# Patient Record
Sex: Male | Born: 1975 | Race: White | Hispanic: No | Marital: Single | Smoking: Current every day smoker
Health system: Southern US, Community
[De-identification: ages and names within clinical notes are randomized; demographics above are authoritative.]

## PROBLEM LIST (undated history)

## (undated) DIAGNOSIS — F32A Depression, unspecified: Secondary | ICD-10-CM

## (undated) DIAGNOSIS — S2249XA Multiple fractures of ribs, unspecified side, initial encounter for closed fracture: Secondary | ICD-10-CM

## (undated) DIAGNOSIS — F329 Major depressive disorder, single episode, unspecified: Secondary | ICD-10-CM

## (undated) DIAGNOSIS — Z720 Tobacco use: Secondary | ICD-10-CM

## (undated) HISTORY — DX: Multiple fractures of ribs, unspecified side, initial encounter for closed fracture: S22.49XA

## (undated) HISTORY — DX: Morbid (severe) obesity due to excess calories: E66.01

## (undated) HISTORY — DX: Major depressive disorder, single episode, unspecified: F32.9

## (undated) HISTORY — DX: Depression, unspecified: F32.A

## (undated) HISTORY — DX: Tobacco use: Z72.0

---

## 2001-09-16 ENCOUNTER — Emergency Department (HOSPITAL_COMMUNITY): Admission: EM | Admit: 2001-09-16 | Discharge: 2001-09-16 | Payer: Self-pay | Admitting: Emergency Medicine

## 2001-09-16 ENCOUNTER — Encounter: Payer: Self-pay | Admitting: Emergency Medicine

## 2005-03-28 ENCOUNTER — Emergency Department (HOSPITAL_COMMUNITY): Admission: EM | Admit: 2005-03-28 | Discharge: 2005-03-28 | Payer: Self-pay | Admitting: Emergency Medicine

## 2005-07-17 ENCOUNTER — Ambulatory Visit: Payer: Self-pay | Admitting: Internal Medicine

## 2005-07-19 ENCOUNTER — Ambulatory Visit: Payer: Self-pay | Admitting: Internal Medicine

## 2006-06-17 ENCOUNTER — Ambulatory Visit: Payer: Self-pay | Admitting: Internal Medicine

## 2006-08-25 DIAGNOSIS — F1410 Cocaine abuse, uncomplicated: Secondary | ICD-10-CM | POA: Insufficient documentation

## 2006-08-25 DIAGNOSIS — F17200 Nicotine dependence, unspecified, uncomplicated: Secondary | ICD-10-CM | POA: Insufficient documentation

## 2006-08-25 DIAGNOSIS — R5383 Other fatigue: Secondary | ICD-10-CM

## 2006-08-25 DIAGNOSIS — R5381 Other malaise: Secondary | ICD-10-CM

## 2007-06-21 ENCOUNTER — Emergency Department (HOSPITAL_COMMUNITY): Admission: EM | Admit: 2007-06-21 | Discharge: 2007-06-21 | Payer: Self-pay | Admitting: Emergency Medicine

## 2010-09-09 DIAGNOSIS — J939 Pneumothorax, unspecified: Secondary | ICD-10-CM

## 2010-09-09 HISTORY — DX: Pneumothorax, unspecified: J93.9

## 2010-12-18 ENCOUNTER — Emergency Department (HOSPITAL_COMMUNITY): Payer: No Typology Code available for payment source

## 2010-12-18 ENCOUNTER — Inpatient Hospital Stay (HOSPITAL_COMMUNITY)
Admission: EM | Admit: 2010-12-18 | Discharge: 2011-01-01 | DRG: 981 | Disposition: A | Discharge status: Home Health | Payer: No Typology Code available for payment source | Attending: Thoracic Surgery | Admitting: Thoracic Surgery

## 2010-12-18 DIAGNOSIS — S225XXA Flail chest, initial encounter for closed fracture: Principal | ICD-10-CM | POA: Diagnosis present

## 2010-12-18 DIAGNOSIS — R413 Other amnesia: Secondary | ICD-10-CM | POA: Diagnosis present

## 2010-12-18 DIAGNOSIS — R4182 Altered mental status, unspecified: Secondary | ICD-10-CM | POA: Diagnosis present

## 2010-12-18 DIAGNOSIS — J153 Pneumonia due to streptococcus, group B: Secondary | ICD-10-CM | POA: Diagnosis not present

## 2010-12-18 DIAGNOSIS — J9600 Acute respiratory failure, unspecified whether with hypoxia or hypercapnia: Secondary | ICD-10-CM | POA: Diagnosis not present

## 2010-12-18 DIAGNOSIS — J9819 Other pulmonary collapse: Secondary | ICD-10-CM | POA: Diagnosis not present

## 2010-12-18 DIAGNOSIS — F17200 Nicotine dependence, unspecified, uncomplicated: Secondary | ICD-10-CM | POA: Diagnosis present

## 2010-12-18 DIAGNOSIS — Z781 Physical restraint status: Secondary | ICD-10-CM | POA: Diagnosis not present

## 2010-12-18 DIAGNOSIS — J9383 Other pneumothorax: Secondary | ICD-10-CM | POA: Diagnosis present

## 2010-12-18 DIAGNOSIS — S0180XA Unspecified open wound of other part of head, initial encounter: Secondary | ICD-10-CM | POA: Diagnosis present

## 2010-12-18 DIAGNOSIS — Z6841 Body Mass Index (BMI) 40.0 and over, adult: Secondary | ICD-10-CM

## 2010-12-18 DIAGNOSIS — Y998 Other external cause status: Secondary | ICD-10-CM

## 2010-12-18 DIAGNOSIS — D62 Acute posthemorrhagic anemia: Secondary | ICD-10-CM | POA: Diagnosis not present

## 2010-12-18 DIAGNOSIS — E876 Hypokalemia: Secondary | ICD-10-CM | POA: Diagnosis not present

## 2010-12-18 LAB — POCT I-STAT, CHEM 8
BUN: 16 mg/dL (ref 6–23)
Calcium, Ion: 1.18 mmol/L (ref 1.12–1.32)
Chloride: 103 mEq/L (ref 96–112)
Creatinine, Ser: 1.3 mg/dL (ref 0.4–1.5)
Glucose, Bld: 123 mg/dL — ABNORMAL HIGH (ref 70–99)
HCT: 52 % (ref 39.0–52.0)
Hemoglobin: 17.7 g/dL — ABNORMAL HIGH (ref 13.0–17.0)
Potassium: 3.9 mEq/L (ref 3.5–5.1)
Sodium: 142 mEq/L (ref 135–145)
TCO2: 29 mmol/L (ref 0–100)

## 2010-12-18 LAB — CBC
HCT: 50.9 % (ref 39.0–52.0)
MCHC: 34 g/dL (ref 30.0–36.0)
MCV: 88.8 fL (ref 78.0–100.0)
RDW: 13.9 % (ref 11.5–15.5)
WBC: 24.6 10*3/uL — ABNORMAL HIGH (ref 4.0–10.5)

## 2010-12-18 LAB — BASIC METABOLIC PANEL
BUN: 13 mg/dL (ref 6–23)
GFR calc non Af Amer: >60 mL/min (ref >60)
Glucose, Bld: 112 mg/dL — ABNORMAL HIGH (ref 70–99)
Potassium: 4 mEq/L (ref 3.5–5.1)

## 2010-12-19 ENCOUNTER — Inpatient Hospital Stay (HOSPITAL_COMMUNITY): Payer: No Typology Code available for payment source

## 2010-12-19 LAB — CBC
HCT: 47.3 % (ref 39.0–52.0)
Hemoglobin: 16.1 g/dL (ref 13.0–17.0)
MCH: 30.4 pg (ref 26.0–34.0)
MCHC: 34 g/dL (ref 30.0–36.0)
MCV: 89.4 fL (ref 78.0–100.0)

## 2010-12-19 LAB — BASIC METABOLIC PANEL
BUN: 15 mg/dL (ref 6–23)
CO2: 24 mEq/L (ref 19–32)
Calcium: 9.1 mg/dL (ref 8.4–10.5)
Creatinine, Ser: 1.14 mg/dL (ref 0.4–1.5)
GFR calc Af Amer: >60 mL/min (ref >60)
Glucose, Bld: 119 mg/dL — ABNORMAL HIGH (ref 70–99)

## 2010-12-19 MED ORDER — IOHEXOL 300 MG/ML  SOLN
100.0000 mL | Freq: Once | INTRAMUSCULAR | Status: AC | PRN
  Administered 2010-12-19: 100 mL via INTRAVENOUS

## 2010-12-20 ENCOUNTER — Inpatient Hospital Stay (HOSPITAL_COMMUNITY): Payer: No Typology Code available for payment source

## 2010-12-21 ENCOUNTER — Inpatient Hospital Stay (HOSPITAL_COMMUNITY): Payer: No Typology Code available for payment source

## 2010-12-21 LAB — POCT I-STAT 3, ART BLOOD GAS (G3+)
Bicarbonate: 30.2 meq/L — ABNORMAL HIGH (ref 20.0–24.0)
TCO2: 29 mmol/L (ref 0–100)
pCO2 arterial: 87.6 mmHg (ref 35.0–45.0)
pCO2 arterial: 49.7 mmHg — ABNORMAL HIGH (ref 35.0–45.0)
pH, Arterial: 7.183 — CL (ref 7.350–7.450)
pH, Arterial: 7.242 — ABNORMAL LOW (ref 7.350–7.450)
pH, Arterial: 7.347 — ABNORMAL LOW (ref 7.350–7.450)
pO2, Arterial: 218 mmHg — ABNORMAL HIGH (ref 80.0–100.0)

## 2010-12-21 LAB — CBC
Platelets: 191 10*3/uL (ref 150–400)
RBC: 4.46 MIL/uL (ref 4.22–5.81)
RDW: 13.9 % (ref 11.5–15.5)
WBC: 15.1 10*3/uL — ABNORMAL HIGH (ref 4.0–10.5)

## 2010-12-21 LAB — EXPECTORATED SPUTUM ASSESSMENT W GRAM STAIN, RFLX TO RESP C

## 2010-12-21 LAB — BASIC METABOLIC PANEL
BUN: 26 mg/dL — ABNORMAL HIGH (ref 6–23)
Chloride: 105 mEq/L (ref 96–112)
Creatinine, Ser: 1.01 mg/dL (ref 0.4–1.5)
GFR calc Af Amer: >60 mL/min (ref >60)
GFR calc non Af Amer: >60 mL/min (ref >60)
Potassium: 5.4 mEq/L — ABNORMAL HIGH (ref 3.5–5.1)

## 2010-12-22 ENCOUNTER — Inpatient Hospital Stay (HOSPITAL_COMMUNITY): Payer: No Typology Code available for payment source

## 2010-12-22 LAB — POCT I-STAT 3, ART BLOOD GAS (G3+)
O2 Saturation: 94 %
pCO2 arterial: 41.3 mmHg (ref 35.0–45.0)
pH, Arterial: 7.414 (ref 7.350–7.450)
pO2, Arterial: 74 mmHg — ABNORMAL LOW (ref 80.0–100.0)

## 2010-12-22 LAB — CBC
Hemoglobin: 11.8 g/dL — ABNORMAL LOW (ref 13.0–17.0)
MCH: 28.7 pg (ref 26.0–34.0)
Platelets: 188 10*3/uL (ref 150–400)
RBC: 4.11 MIL/uL — ABNORMAL LOW (ref 4.22–5.81)
WBC: 13.1 10*3/uL — ABNORMAL HIGH (ref 4.0–10.5)

## 2010-12-22 LAB — BASIC METABOLIC PANEL
CO2: 26 mEq/L (ref 19–32)
Chloride: 106 mEq/L (ref 96–112)
Creatinine, Ser: 0.99 mg/dL (ref 0.4–1.5)
GFR calc Af Amer: >60 mL/min (ref >60)
Potassium: 4.3 mEq/L (ref 3.5–5.1)
Sodium: 140 mEq/L (ref 135–145)

## 2010-12-23 ENCOUNTER — Inpatient Hospital Stay (HOSPITAL_COMMUNITY): Payer: No Typology Code available for payment source

## 2010-12-23 DIAGNOSIS — S2249XA Multiple fractures of ribs, unspecified side, initial encounter for closed fracture: Secondary | ICD-10-CM

## 2010-12-23 LAB — COMPREHENSIVE METABOLIC PANEL
Alkaline Phosphatase: 39 U/L (ref 39–117)
Calcium: 8.4 mg/dL (ref 8.4–10.5)
GFR calc Af Amer: >60 mL/min (ref >60)
GFR calc non Af Amer: 59 mL/min — ABNORMAL LOW (ref >60)
Potassium: 3.9 mEq/L (ref 3.5–5.1)
Sodium: 144 mEq/L (ref 135–145)
Total Bilirubin: 1.9 mg/dL — ABNORMAL HIGH (ref 0.3–1.2)
Total Protein: 5.4 g/dL — ABNORMAL LOW (ref 6.0–8.3)

## 2010-12-23 LAB — CBC
Hemoglobin: 10.3 g/dL — ABNORMAL LOW (ref 13.0–17.0)
MCH: 29.4 pg (ref 26.0–34.0)
MCHC: 33 g/dL (ref 30.0–36.0)
RDW: 14.4 % (ref 11.5–15.5)

## 2010-12-23 LAB — CULTURE, BAL-QUANTITATIVE W GRAM STAIN: Colony Count: >=100000

## 2010-12-23 LAB — BLOOD GAS, ARTERIAL
Acid-Base Excess: 2.3 mmol/L — ABNORMAL HIGH (ref 0.0–2.0)
Bicarbonate: 26.1 mEq/L — ABNORMAL HIGH (ref 20.0–24.0)
Drawn by: 13898
FIO2: 0.4 %
RATE: 20 resp/min
pCO2 arterial: 37.2 mmHg (ref 35.0–45.0)
pO2, Arterial: 70.3 mmHg — ABNORMAL LOW (ref 80.0–100.0)

## 2010-12-24 ENCOUNTER — Inpatient Hospital Stay (HOSPITAL_COMMUNITY): Payer: No Typology Code available for payment source

## 2010-12-24 LAB — BASIC METABOLIC PANEL
BUN: 27 mg/dL — ABNORMAL HIGH (ref 6–23)
CO2: 25 mEq/L (ref 19–32)
Calcium: 8.2 mg/dL — ABNORMAL LOW (ref 8.4–10.5)
GFR calc non Af Amer: >60 mL/min (ref >60)
Glucose, Bld: 90 mg/dL (ref 70–99)

## 2010-12-24 LAB — POCT I-STAT 3, ART BLOOD GAS (G3+)
Acid-Base Excess: 1 mmol/L (ref 0.0–2.0)
Bicarbonate: 26.5 meq/L — ABNORMAL HIGH (ref 20.0–24.0)
O2 Saturation: 94 %
Patient temperature: 99.5
pO2, Arterial: 77 mmHg — ABNORMAL LOW (ref 80.0–100.0)

## 2010-12-24 LAB — CBC
Hemoglobin: 9.5 g/dL — ABNORMAL LOW (ref 13.0–17.0)
MCH: 29 pg (ref 26.0–34.0)
MCHC: 32.5 g/dL (ref 30.0–36.0)
RDW: 14.8 % (ref 11.5–15.5)

## 2010-12-24 LAB — ABO/RH: ABO/RH(D): A POS

## 2010-12-24 LAB — PREPARE RBC (CROSSMATCH)
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
## 2010-12-25 ENCOUNTER — Inpatient Hospital Stay (HOSPITAL_COMMUNITY): Payer: No Typology Code available for payment source

## 2010-12-25 DIAGNOSIS — S2249XA Multiple fractures of ribs, unspecified side, initial encounter for closed fracture: Secondary | ICD-10-CM

## 2010-12-25 HISTORY — PX: OTHER SURGICAL HISTORY: SHX169

## 2010-12-25 HISTORY — DX: Multiple fractures of ribs, unspecified side, initial encounter for closed fracture: S22.49XA

## 2010-12-25 LAB — POCT I-STAT 3, ART BLOOD GAS (G3+)
Acid-Base Excess: 1 mmol/L (ref 0.0–2.0)
Bicarbonate: 25.5 meq/L — ABNORMAL HIGH (ref 20.0–24.0)
O2 Saturation: 97 %
Patient temperature: 37.9
pO2, Arterial: 94 mmHg (ref 80.0–100.0)

## 2010-12-25 LAB — BASIC METABOLIC PANEL
BUN: 23 mg/dL (ref 6–23)
Calcium: 8.1 mg/dL — ABNORMAL LOW (ref 8.4–10.5)
Creatinine, Ser: 0.93 mg/dL (ref 0.4–1.5)
GFR calc non Af Amer: >60 mL/min (ref >60)
Glucose, Bld: 123 mg/dL — ABNORMAL HIGH (ref 70–99)
Potassium: 3.7 mEq/L (ref 3.5–5.1)

## 2010-12-25 LAB — DIFFERENTIAL
Eosinophils Absolute: 0.2 10*3/uL (ref 0.0–0.7)
Eosinophils Relative: 3 % (ref 0–5)
Lymphocytes Relative: 11 % — ABNORMAL LOW (ref 12–46)
Lymphs Abs: 1 10*3/uL (ref 0.7–4.0)
Monocytes Absolute: 0.6 10*3/uL (ref 0.1–1.0)
Monocytes Relative: 6 % (ref 3–12)

## 2010-12-25 LAB — CBC
HCT: 31.5 % — ABNORMAL LOW (ref 39.0–52.0)
MCH: 29.3 pg (ref 26.0–34.0)
MCHC: 32.7 g/dL (ref 30.0–36.0)
MCV: 89.5 fL (ref 78.0–100.0)
Platelets: 168 10*3/uL (ref 150–400)
RDW: 15 % (ref 11.5–15.5)

## 2010-12-26 ENCOUNTER — Inpatient Hospital Stay (HOSPITAL_COMMUNITY): Payer: No Typology Code available for payment source

## 2010-12-26 LAB — CBC
HCT: 31.6 % — ABNORMAL LOW (ref 39.0–52.0)
MCHC: 32.6 g/dL (ref 30.0–36.0)
Platelets: 195 10*3/uL (ref 150–400)
RDW: 15 % (ref 11.5–15.5)

## 2010-12-26 LAB — BASIC METABOLIC PANEL
CO2: 24 mEq/L (ref 19–32)
Glucose, Bld: 133 mg/dL — ABNORMAL HIGH (ref 70–99)
Potassium: 4.1 mEq/L (ref 3.5–5.1)
Sodium: 146 mEq/L — ABNORMAL HIGH (ref 135–145)

## 2010-12-26 LAB — POCT I-STAT 3, ART BLOOD GAS (G3+)
Acid-Base Excess: 2 mmol/L (ref 0.0–2.0)
Bicarbonate: 26.8 meq/L — ABNORMAL HIGH (ref 20.0–24.0)
Patient temperature: 38.5
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
## 2010-12-27 ENCOUNTER — Inpatient Hospital Stay (HOSPITAL_COMMUNITY): Payer: No Typology Code available for payment source

## 2010-12-27 LAB — COMPREHENSIVE METABOLIC PANEL
ALT: 44 U/L (ref 0–53)
CO2: 26 mEq/L (ref 19–32)
Calcium: 8 mg/dL — ABNORMAL LOW (ref 8.4–10.5)
Creatinine, Ser: 0.97 mg/dL (ref 0.4–1.5)
GFR calc non Af Amer: >60 mL/min (ref >60)
Glucose, Bld: 135 mg/dL — ABNORMAL HIGH (ref 70–99)

## 2010-12-27 LAB — CBC
HCT: 30.3 % — ABNORMAL LOW (ref 39.0–52.0)
Hemoglobin: 9.7 g/dL — ABNORMAL LOW (ref 13.0–17.0)
MCH: 28.4 pg (ref 26.0–34.0)
MCHC: 32 g/dL (ref 30.0–36.0)
MCV: 88.9 fL (ref 78.0–100.0)

## 2010-12-27 LAB — URINE CULTURE: Colony Count: NO GROWTH

## 2010-12-28 ENCOUNTER — Inpatient Hospital Stay (HOSPITAL_COMMUNITY): Payer: No Typology Code available for payment source

## 2010-12-28 LAB — BLOOD GAS, ARTERIAL
Acid-Base Excess: 2.3 mmol/L — ABNORMAL HIGH (ref 0.0–2.0)
Bicarbonate: 25.9 mEq/L — ABNORMAL HIGH (ref 20.0–24.0)
FIO2: 40 %
O2 Saturation: 97.2 %
RATE: 20 resp/min
TCO2: 27.1 mmol/L (ref 0–100)
pO2, Arterial: 97.6 mmHg (ref 80.0–100.0)

## 2010-12-28 LAB — CROSSMATCH
ABO/RH(D): A POS
Antibody Screen: NEGATIVE
Unit division: 0

## 2010-12-28 LAB — DIFFERENTIAL
Lymphocytes Relative: 11 % — ABNORMAL LOW (ref 12–46)
Lymphs Abs: 1.2 10*3/uL (ref 0.7–4.0)
Monocytes Absolute: 1 10*3/uL (ref 0.1–1.0)
Monocytes Relative: 8 % (ref 3–12)
Neutro Abs: 8.8 10*3/uL — ABNORMAL HIGH (ref 1.7–7.7)

## 2010-12-28 LAB — CBC
HCT: 29 % — ABNORMAL LOW (ref 39.0–52.0)
Hemoglobin: 9.5 g/dL — ABNORMAL LOW (ref 13.0–17.0)
MCH: 29.5 pg (ref 26.0–34.0)
MCHC: 32.8 g/dL (ref 30.0–36.0)
MCV: 90.1 fL (ref 78.0–100.0)
RBC: 3.22 MIL/uL — ABNORMAL LOW (ref 4.22–5.81)

## 2010-12-28 LAB — BASIC METABOLIC PANEL
CO2: 27 mEq/L (ref 19–32)
Calcium: 8 mg/dL — ABNORMAL LOW (ref 8.4–10.5)
Chloride: 113 mEq/L — ABNORMAL HIGH (ref 96–112)
Creatinine, Ser: 0.93 mg/dL (ref 0.4–1.5)
Glucose, Bld: 123 mg/dL — ABNORMAL HIGH (ref 70–99)

## 2010-12-28 LAB — CULTURE, RESPIRATORY W GRAM STAIN

## 2010-12-29 ENCOUNTER — Inpatient Hospital Stay (HOSPITAL_COMMUNITY): Payer: No Typology Code available for payment source

## 2010-12-29 LAB — BASIC METABOLIC PANEL
CO2: 28 mEq/L (ref 19–32)
Calcium: 8.1 mg/dL — ABNORMAL LOW (ref 8.4–10.5)
Creatinine, Ser: 0.64 mg/dL (ref 0.4–1.5)
GFR calc non Af Amer: >60 mL/min (ref >60)
Glucose, Bld: 88 mg/dL (ref 70–99)
Sodium: 143 mEq/L (ref 135–145)

## 2010-12-29 LAB — CBC
HCT: 33.6 % — ABNORMAL LOW (ref 39.0–52.0)
Hemoglobin: 10.5 g/dL — ABNORMAL LOW (ref 13.0–17.0)
MCH: 28.5 pg (ref 26.0–34.0)
MCHC: 31.3 g/dL (ref 30.0–36.0)
RDW: 14.4 % (ref 11.5–15.5)

## 2010-12-29 LAB — DIFFERENTIAL
Basophils Absolute: 0 10*3/uL (ref 0.0–0.1)
Basophils Relative: 0 % (ref 0–1)
Eosinophils Relative: 3 % (ref 0–5)
Lymphocytes Relative: 9 % — ABNORMAL LOW (ref 12–46)
Monocytes Absolute: 0.9 10*3/uL (ref 0.1–1.0)
Monocytes Relative: 5 % (ref 3–12)

## 2010-12-30 ENCOUNTER — Inpatient Hospital Stay (HOSPITAL_COMMUNITY): Payer: No Typology Code available for payment source

## 2010-12-30 LAB — BASIC METABOLIC PANEL
Calcium: 8 mg/dL — ABNORMAL LOW (ref 8.4–10.5)
Creatinine, Ser: 0.67 mg/dL (ref 0.4–1.5)
GFR calc Af Amer: >60 mL/min (ref >60)
GFR calc non Af Amer: >60 mL/min (ref >60)
Sodium: 143 mEq/L (ref 135–145)

## 2010-12-30 LAB — CBC
Hemoglobin: 10.7 g/dL — ABNORMAL LOW (ref 13.0–17.0)
MCH: 28.7 pg (ref 26.0–34.0)
MCHC: 32.1 g/dL (ref 30.0–36.0)
Platelets: 277 10*3/uL (ref 150–400)
RDW: 14.2 % (ref 11.5–15.5)

## 2010-12-31 ENCOUNTER — Inpatient Hospital Stay (HOSPITAL_COMMUNITY): Payer: No Typology Code available for payment source

## 2010-12-31 LAB — BASIC METABOLIC PANEL
GFR calc Af Amer: >60 mL/min (ref >60)
GFR calc non Af Amer: >60 mL/min (ref >60)
Potassium: 3.4 mEq/L — ABNORMAL LOW (ref 3.5–5.1)
Sodium: 140 mEq/L (ref 135–145)

## 2010-12-31 LAB — CBC
Platelets: 301 10*3/uL (ref 150–400)
RBC: 3.63 MIL/uL — ABNORMAL LOW (ref 4.22–5.81)
RDW: 14.2 % (ref 11.5–15.5)
WBC: 11.9 10*3/uL — ABNORMAL HIGH (ref 4.0–10.5)

## 2011-01-01 ENCOUNTER — Inpatient Hospital Stay (HOSPITAL_COMMUNITY): Payer: No Typology Code available for payment source

## 2011-01-01 LAB — CULTURE, BLOOD (ROUTINE X 2): Culture  Setup Time: 201204182152

## 2011-01-09 ENCOUNTER — Other Ambulatory Visit: Payer: Self-pay | Admitting: Thoracic Surgery

## 2011-01-09 DIAGNOSIS — S2249XA Multiple fractures of ribs, unspecified side, initial encounter for closed fracture: Secondary | ICD-10-CM

## 2011-01-10 ENCOUNTER — Ambulatory Visit (INDEPENDENT_AMBULATORY_CARE_PROVIDER_SITE_OTHER): Payer: Self-pay | Admitting: Thoracic Surgery

## 2011-01-10 ENCOUNTER — Ambulatory Visit
Admission: RE | Admit: 2011-01-10 | Discharge: 2011-01-10 | Disposition: A | Discharge status: Home / Self Care | Payer: No Typology Code available for payment source | Source: Ambulatory Visit | Attending: Thoracic Surgery | Admitting: Thoracic Surgery

## 2011-01-10 DIAGNOSIS — S2249XA Multiple fractures of ribs, unspecified side, initial encounter for closed fracture: Secondary | ICD-10-CM

## 2011-01-11 NOTE — Assessment & Plan Note (Signed)
OFFICE VISIT  [name], [name] DOB:  11-16-1975                                        Jan 10, 2011 CHART #:  [id number]  The patient came today and he is doing much better.  We removed his staples and his stitches.  His chest x-ray shows the plates in good position.  He is breathing okay.  His blood pressure was 130/82, pulse 98, respirations 18, sats were 98%.  I told him to gradually increase his activities and he could start driving in another week.  He is to try decrease his pain medication.  We gave him a refill for Vicodin 7.5.  I will see him again in 3 weeks and he is satisfied with his progress.  Ines Bloomer, M.D. Electronically Signed  DPB/MEDQ  D:  01/10/2011  T:  01/11/2011  Job:  [id number]  cc:   Abigail Miyamoto, M.D.
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
## 2011-01-29 ENCOUNTER — Other Ambulatory Visit: Payer: Self-pay | Admitting: Thoracic Surgery

## 2011-01-29 DIAGNOSIS — D492 Neoplasm of unspecified behavior of bone, soft tissue, and skin: Secondary | ICD-10-CM

## 2011-01-30 ENCOUNTER — Ambulatory Visit
Admission: RE | Admit: 2011-01-30 | Discharge: 2011-01-30 | Disposition: A | Discharge status: Home / Self Care | Payer: No Typology Code available for payment source | Source: Ambulatory Visit | Attending: Thoracic Surgery | Admitting: Thoracic Surgery

## 2011-01-30 ENCOUNTER — Ambulatory Visit (INDEPENDENT_AMBULATORY_CARE_PROVIDER_SITE_OTHER): Payer: Self-pay | Admitting: Thoracic Surgery

## 2011-01-30 DIAGNOSIS — S2249XA Multiple fractures of ribs, unspecified side, initial encounter for closed fracture: Secondary | ICD-10-CM

## 2011-01-30 DIAGNOSIS — D492 Neoplasm of unspecified behavior of bone, soft tissue, and skin: Secondary | ICD-10-CM

## 2011-01-30 DIAGNOSIS — S2239XA Fracture of one rib, unspecified side, initial encounter for closed fracture: Secondary | ICD-10-CM

## 2011-01-31 NOTE — Assessment & Plan Note (Signed)
OFFICE VISIT  [name], ROUT DOB:  [birth year]                                        Jan 30, 2011 CHART #:  [id number]  The patient comes in today for a 3-week followup.  He is status post a left thoracotomy with rib plating of ribs 3 through 6 on December 25, 2010. He continues to have a fair amount of pain particularly with increased activity.  His breathing has been stable, however, and the Vicodin seems to be working well for him.  He is slowly resuming his regular activities.  He has had a slight cough but this is improving as well.  PHYSICAL EXAMINATION:  Vital Signs:  Blood pressure is 148/97, pulse is 82, respirations 20, O2 sat 95% on room air.  General:  His left thoracotomy and chest tube incisions have all healed well.  Heart: Regular rate and rhythm without murmurs, rubs or gallops.  Lungs:  Some coarse breath sounds bilaterally which clears with cough.  IMAGING:  Chest x-ray is stable with rib plates in good position and expected postoperative changes.  ASSESSMENT/PLAN:  The patient is progressing well status post open reduction and internal reduction of left ribs 2-6.  Dr. Edwyna Shell saw the patient today and reviewed his films.  We will give him a refill of Vicodin 7.5, #40 and we will see him back in 6 weeks for recheck.  Coral Ceo, P.A.  GC/MEDQ  D:  01/30/2011  T:  01/31/2011  Job:  [id number]  cc:   Abigail Miyamoto, M.D.

## 2011-03-11 ENCOUNTER — Other Ambulatory Visit: Payer: Self-pay | Admitting: Thoracic Surgery

## 2011-03-11 DIAGNOSIS — S2249XA Multiple fractures of ribs, unspecified side, initial encounter for closed fracture: Secondary | ICD-10-CM

## 2011-03-12 ENCOUNTER — Ambulatory Visit (INDEPENDENT_AMBULATORY_CARE_PROVIDER_SITE_OTHER): Payer: Self-pay | Admitting: Thoracic Surgery

## 2011-03-12 ENCOUNTER — Ambulatory Visit
Admission: RE | Admit: 2011-03-12 | Discharge: 2011-03-12 | Disposition: A | Discharge status: Home / Self Care | Payer: No Typology Code available for payment source | Source: Ambulatory Visit | Attending: Thoracic Surgery | Admitting: Thoracic Surgery

## 2011-03-12 DIAGNOSIS — S2249XA Multiple fractures of ribs, unspecified side, initial encounter for closed fracture: Secondary | ICD-10-CM
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
## 2011-04-22 ENCOUNTER — Other Ambulatory Visit: Payer: Self-pay | Admitting: Thoracic Surgery

## 2011-04-22 DIAGNOSIS — S2249XA Multiple fractures of ribs, unspecified side, initial encounter for closed fracture: Secondary | ICD-10-CM

## 2011-04-23 ENCOUNTER — Ambulatory Visit
Admission: RE | Admit: 2011-04-23 | Discharge: 2011-04-23 | Disposition: A | Discharge status: Home / Self Care | Payer: No Typology Code available for payment source | Source: Ambulatory Visit | Attending: Thoracic Surgery | Admitting: Thoracic Surgery

## 2011-04-23 ENCOUNTER — Ambulatory Visit (INDEPENDENT_AMBULATORY_CARE_PROVIDER_SITE_OTHER): Payer: Self-pay | Admitting: Thoracic Surgery

## 2011-04-23 DIAGNOSIS — S2249XA Multiple fractures of ribs, unspecified side, initial encounter for closed fracture: Secondary | ICD-10-CM

## 2011-04-23 NOTE — Assessment & Plan Note (Signed)
OFFICE VISIT  [name], [name] DOB:  [birth year]                                        April 23, 2011 CHART #:  [id number]  The patient came for followup after his ORIF of his left rib.  He had some fractures on the right ribs and nicely he can see the callus formation on the right.  The left ribs are healing well and there is good fixation.  He still complains of numbness on the left side and has been using 40 of 5/7.5 mg of hydrocodone every 2 weeks.  We told him that he had to stop down on this and we gave him prescriptions for 5/300 of hydrocodone #50 in his last at least a month.  I will see also if we can add Neurontin 300 mg to take 1 twice a day per week and then increase to 2 twice a day to hopefully decrease his pain.  We will see him back in 3 weeks with a chest x-ray for a final check.  Ines Bloomer, M.D. Electronically Signed  DPB/MEDQ  D:  04/23/2011  T:  04/23/2011  Job:  [id number]

## 2011-05-20 ENCOUNTER — Telehealth: Payer: Self-pay | Admitting: *Deleted

## 2011-05-20 ENCOUNTER — Emergency Department (HOSPITAL_COMMUNITY)
Admission: EM | Admit: 2011-05-20 | Discharge: 2011-05-20 | Disposition: A | Discharge status: Home / Self Care | Payer: Self-pay | Attending: Emergency Medicine | Admitting: Emergency Medicine

## 2011-05-20 DIAGNOSIS — M545 Low back pain, unspecified: Secondary | ICD-10-CM | POA: Insufficient documentation

## 2011-05-20 LAB — URINALYSIS, ROUTINE W REFLEX MICROSCOPIC
Ketones, ur: NEGATIVE mg/dL
Leukocytes, UA: NEGATIVE
Nitrite: NEGATIVE
Specific Gravity, Urine: 1.028 (ref 1.005–1.030)
pH: 6 (ref 5.0–8.0)

## 2011-05-23 ENCOUNTER — Encounter: Payer: Self-pay | Admitting: *Deleted

## 2011-06-19 ENCOUNTER — Other Ambulatory Visit: Payer: Self-pay | Admitting: Thoracic Surgery

## 2011-06-19 ENCOUNTER — Encounter: Payer: Self-pay | Admitting: *Deleted

## 2011-07-22 ENCOUNTER — Encounter: Payer: Self-pay | Admitting: Thoracic Surgery

## 2011-07-22 ENCOUNTER — Other Ambulatory Visit: Payer: Self-pay | Admitting: Thoracic Surgery

## 2011-07-22 DIAGNOSIS — J45909 Unspecified asthma, uncomplicated: Secondary | ICD-10-CM | POA: Insufficient documentation

## 2011-07-22 DIAGNOSIS — F329 Major depressive disorder, single episode, unspecified: Secondary | ICD-10-CM

## 2011-07-22 DIAGNOSIS — S2249XA Multiple fractures of ribs, unspecified side, initial encounter for closed fracture: Secondary | ICD-10-CM

## 2011-07-22 DIAGNOSIS — Z720 Tobacco use: Secondary | ICD-10-CM | POA: Insufficient documentation

## 2011-07-22 DIAGNOSIS — F32A Depression, unspecified: Secondary | ICD-10-CM | POA: Insufficient documentation

## 2011-07-23 ENCOUNTER — Other Ambulatory Visit: Payer: Self-pay | Admitting: Thoracic Surgery

## 2011-07-24 ENCOUNTER — Encounter: Payer: Self-pay | Admitting: *Deleted

## 2011-07-24 ENCOUNTER — Ambulatory Visit
Admission: RE | Admit: 2011-07-24 | Discharge: 2011-07-24 | Disposition: A | Discharge status: Home / Self Care | Payer: No Typology Code available for payment source | Source: Ambulatory Visit | Attending: Thoracic Surgery | Admitting: Thoracic Surgery

## 2011-07-24 ENCOUNTER — Ambulatory Visit (INDEPENDENT_AMBULATORY_CARE_PROVIDER_SITE_OTHER): Payer: Self-pay | Admitting: Thoracic Surgery

## 2011-07-24 ENCOUNTER — Encounter: Payer: Self-pay | Admitting: Thoracic Surgery

## 2011-07-24 ENCOUNTER — Ambulatory Visit: Payer: Self-pay | Admitting: Thoracic Surgery

## 2011-07-24 VITALS — BP 110/70 | HR 75 | Resp 18 | Ht 74.0 in | Wt 350.0 lb

## 2011-07-24 DIAGNOSIS — S2249XA Multiple fractures of ribs, unspecified side, initial encounter for closed fracture: Secondary | ICD-10-CM

## 2011-07-24 DIAGNOSIS — S2239XA Fracture of one rib, unspecified side, initial encounter for closed fracture: Secondary | ICD-10-CM
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
## 2012-03-04 ENCOUNTER — Emergency Department (HOSPITAL_COMMUNITY)
Admission: EM | Admit: 2012-03-04 | Discharge: 2012-03-04 | Disposition: A | Discharge status: Home / Self Care | Payer: Self-pay | Attending: Emergency Medicine | Admitting: Emergency Medicine

## 2012-03-04 ENCOUNTER — Encounter (HOSPITAL_COMMUNITY): Payer: Self-pay | Admitting: *Deleted

## 2012-03-04 DIAGNOSIS — J45909 Unspecified asthma, uncomplicated: Secondary | ICD-10-CM | POA: Insufficient documentation

## 2012-03-04 DIAGNOSIS — M549 Dorsalgia, unspecified: Secondary | ICD-10-CM | POA: Insufficient documentation

## 2012-03-04 DIAGNOSIS — G8929 Other chronic pain: Secondary | ICD-10-CM | POA: Insufficient documentation

## 2012-03-04 DIAGNOSIS — F17200 Nicotine dependence, unspecified, uncomplicated: Secondary | ICD-10-CM | POA: Insufficient documentation

## 2012-03-04 DIAGNOSIS — Z79899 Other long term (current) drug therapy: Secondary | ICD-10-CM | POA: Insufficient documentation

## 2012-03-04 DIAGNOSIS — Z882 Allergy status to sulfonamides status: Secondary | ICD-10-CM | POA: Insufficient documentation

## 2012-03-04 DIAGNOSIS — Z87828 Personal history of other (healed) physical injury and trauma: Secondary | ICD-10-CM | POA: Insufficient documentation

## 2012-03-04 DIAGNOSIS — R030 Elevated blood-pressure reading, without diagnosis of hypertension: Secondary | ICD-10-CM | POA: Insufficient documentation

## 2012-03-04 MED ORDER — HYDROCHLOROTHIAZIDE 25 MG PO TABS: 25.0000 mg | ORAL_TABLET | Freq: Every day | ORAL | Status: DC

## 2012-03-04 MED ORDER — NAPROXEN 500 MG PO TABS: 500.0000 mg | ORAL_TABLET | Freq: Two times a day (BID) | ORAL | Status: AC
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
## 2012-03-04 NOTE — ED Notes (Signed)
Pt sts he was having a physical done and was told his BP was high and came here for eval. No Hx HTN or meds, but sts "everytime I've been here they've said it was up, but not critical." C/o only slight HA since this am.

## 2012-03-04 NOTE — Discharge Instructions (Signed)
Please make sure eat healthy, avoid foods high in fat and salt. Exercise. This will help your blood pressure and back pain. Naprosyn for pain. Take HCTZ for blood pressure. It is imperative that you follow up with a primary care doctor for further treatment of your blood pressure and your back pain.   Back Exercises Back exercises help treat and prevent back injuries. The goal is to increase your strength in your belly (abdominal) and back muscles. These exercises can also help with flexibility. Start these exercises when told by your doctor. HOME CARE Back exercises include: Pelvic Tilt.  Lie on your back with your knees bent. Tilt your pelvis until the lower part of your back is against the floor. Hold this position 5 to 10 sec. Repeat this exercise 5 to 10 times.  Knee to Chest.  Pull 1 knee up against your chest and hold for 20 to 30 seconds. Repeat this with the other knee. This may be done with the other leg straight or bent, whichever feels better. Then, pull both knees up against your chest.  Sit-Ups or Curl-Ups.  Bend your knees 90 degrees. Start with tilting your pelvis, and do a partial, slow sit-up. Only lift your upper half 30 to 45 degrees off the floor. Take at least 2 to 3 seonds for each sit-up. Do not do sit-ups with your knees out straight. If partial sit-ups are difficult, simply do the above but with only tightening your belly (abdominal) muscles and holding it as told.  Hip-Lift.  Lie on your back with your knees flexed 90 degrees. Push down with your feet and shoulders as you raise your hips 2 inches off the floor. Hold for 10 seconds, repeat 5 to 10 times.  Back Arches.  Lie on your stomach. Prop yourself up on bent elbows. Slowly press on your hands, causing an arch in your low back. Repeat 3 to 5 times.  Shoulder-Lifts.  Lie face down with arms beside your body. Keep hips and belly pressed to floor as you slowly lift your head and shoulders off the floor.  Do not  overdo your exercises. Be careful in the beginning. Exercises may cause you some mild back discomfort. If the pain lasts for more than 15 minutes, stop the exercises until you see your doctor. Improvement with exercise for back problems is slow.  Document Released: 09/28/2010 Document Revised: 08/15/2011 Document Reviewed: 09/28/2010 [facility] Patient Information 2012 [place], [place].  Arterial Hypertension Arterial hypertension (high blood pressure) is a condition of elevated pressure in your blood vessels. Hypertension over a long period of time is a risk factor for strokes, heart attacks, and heart failure. It is also the leading cause of kidney (renal) failure.  CAUSES   In Adults -- Over 90% of all hypertension has no known cause. This is called essential or primary hypertension. In the other 10% of people with hypertension, the increase in blood pressure is caused by another disorder. This is called secondary hypertension. Important causes of secondary hypertension are:   Heavy alcohol use.   Obstructive sleep apnea.   Hyperaldosterosim (Conn's syndrome).   Steroid use.   Chronic kidney failure.   Hyperparathyroidism.   Medications.   Renal artery stenosis.   Pheochromocytoma.   Cushing's disease.   Coarctation of the aorta.   Scleroderma renal crisis.   Licorice (in excessive amounts).   Drugs (cocaine, methamphetamine).  Your caregiver can explain any items above that apply to you.  In Children -- Secondary hypertension is  more common and should always be considered.   Pregnancy -- Few women of childbearing age have high blood pressure. However, up to 10% of them develop hypertension of pregnancy. Generally, this will not harm the woman. It may be a sign of 3 complications of pregnancy: preeclampsia, HELLP syndrome, and eclampsia. Follow up and control with medication is necessary.  SYMPTOMS   This condition normally does not produce any noticeable symptoms. It is  usually found during a routine exam.   Malignant hypertension is a late problem of high blood pressure. It may have the following symptoms:   Headaches.   Blurred vision.   End-organ damage (this means your kidneys, heart, lungs, and other organs are being damaged).   Stressful situations can increase the blood pressure. If a person with normal blood pressure has their blood pressure go up while being seen by their caregiver, this is often termed "white coat hypertension." Its importance is not known. It may be related with eventually developing hypertension or complications of hypertension.   Hypertension is often confused with mental tension, stress, and anxiety.  DIAGNOSIS  The diagnosis is made by 3 separate blood pressure measurements. They are taken at least 1 week apart from each other. If there is organ damage from hypertension, the diagnosis may be made without repeat measurements. Hypertension is usually identified by having blood pressure readings:  Above 140/90 mmHg measured in both arms, at 3 separate times, over a couple weeks.   Over 130/80 mmHg should be considered a risk factor and may require treatment in patients with diabetes.  Blood pressure readings over 120/80 mmHg are called "pre-hypertension" even in non-diabetic patients. To get a true blood pressure measurement, use the following guidelines. Be aware of the factors that can alter blood pressure readings.  Take measurements at least 1 hour after caffeine.   Take measurements 30 minutes after smoking and without any stress. This is another reason to quit smoking - it raises your blood pressure.   Use a proper cuff size. Ask your caregiver if you are not sure about your cuff size.   Most home blood pressure cuffs are automatic. They will measure systolic and diastolic pressures. The systolic pressure is the pressure reading at the start of sounds. Diastolic pressure is the pressure at which the sounds disappear.  If you are elderly, measure pressures in multiple postures. Try sitting, lying or standing.   Sit at rest for a minimum of 5 minutes before taking measurements.   You should not be on any medications like decongestants. These are found in many cold medications.   Record your blood pressure readings and review them with your caregiver.  If you have hypertension:  Your caregiver may do tests to be sure you do not have secondary hypertension (see "causes" above).   Your caregiver may also look for signs of metabolic syndrome. This is also called Syndrome X or Insulin Resistance Syndrome. You may have this syndrome if you have type 2 diabetes, abdominal obesity, and abnormal blood lipids in addition to hypertension.   Your caregiver will take your medical and family history and perform a physical exam.   Diagnostic tests may include blood tests (for glucose, cholesterol, potassium, and kidney function), a urinalysis, or an EKG. Other tests may also be necessary depending on your condition.  PREVENTION  There are important lifestyle issues that you can adopt to reduce your chance of developing hypertension:  Maintain a normal weight.   Limit the amount of salt (  sodium) in your diet.   Exercise often.   Limit alcohol intake.   Get enough potassium in your diet. Discuss specific advice with your caregiver.   Follow a DASH diet (dietary approaches to stop hypertension). This diet is rich in fruits, vegetables, and low-fat dairy products, and avoids certain fats.  PROGNOSIS  Essential hypertension cannot be cured. Lifestyle changes and medical treatment can lower blood pressure and reduce complications. The prognosis of secondary hypertension depends on the underlying cause. Many people whose hypertension is controlled with medicine or lifestyle changes can live a normal, healthy life.  RISKS AND COMPLICATIONS  While high blood pressure alone is not an illness, it often requires treatment due  to its short- and long-term effects on many organs. Hypertension increases your risk for:  CVAs or strokes (cerebrovascular accident).   Heart failure due to chronically high blood pressure (hypertensive cardiomyopathy).   Heart attack (myocardial infarction).   Damage to the retina (hypertensive retinopathy).   Kidney failure (hypertensive nephropathy).  Your caregiver can explain list items above that apply to you. Treatment of hypertension can significantly reduce the risk of complications. TREATMENT   For overweight patients, weight loss and regular exercise are recommended. Physical fitness lowers blood pressure.   Mild hypertension is usually treated with diet and exercise. A diet rich in fruits and vegetables, fat-free dairy products, and foods low in fat and salt (sodium) can help lower blood pressure. Decreasing salt intake decreases blood pressure in a 1/3 of people.   Stop smoking if you are a smoker.  The steps above are highly effective in reducing blood pressure. While these actions are easy to suggest, they are difficult to achieve. Most patients with moderate or severe hypertension end up requiring medications to bring their blood pressure down to a normal level. There are several classes of medications for treatment. Blood pressure pills (antihypertensives) will lower blood pressure by their different actions. Lowering the blood pressure by 10 mmHg may decrease the risk of complications by as much as 25%. The goal of treatment is effective blood pressure control. This will reduce your risk for complications. Your caregiver will help you determine the best treatment for you according to your lifestyle. What is excellent treatment for one person, may not be for you. HOME CARE INSTRUCTIONS   Do not smoke.   Follow the lifestyle changes outlined in the "Prevention" section.   If you are on medications, follow the directions carefully. Blood pressure medications must be taken  as prescribed. Skipping doses reduces their benefit. It also puts you at risk for problems.   Follow up with your caregiver, as directed.   If you are asked to monitor your blood pressure at home, follow the guidelines in the "Diagnosis" section above.  SEEK MEDICAL CARE IF:   You think you are having medication side effects.   You have recurrent headaches or lightheadedness.   You have swelling in your ankles.   You have trouble with your vision.  SEEK IMMEDIATE MEDICAL CARE IF:   You have sudden onset of chest pain or pressure, difficulty breathing, or other symptoms of a heart attack.   You have a severe headache.   You have symptoms of a stroke (such as sudden weakness, difficulty speaking, difficulty walking).  MAKE SURE YOU:   Understand these instructions.   Will watch your condition.   Will get help right away if you are not doing well or get worse.  Document Released: 08/26/2005 Document Revised: 08/15/2011 Document  Reviewed: 03/26/2007 [facility] Patient Information 2012 [place], [place].

## 2012-03-06 NOTE — ED Provider Notes (Signed)
History/physical exam/procedure(s) were performed by non-physician practitioner and as supervising physician I was immediately available for consultation/collaboration. I have reviewed all notes and am in agreement with care and plan.   Hilario Quarry, MD 03/06/12 [phone]
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
## 2012-09-26 IMAGING — CR DG CHEST 1V PORT
1 series · 1 of 1 positions shown · non-contrast
Comparison: 12/25/2010

CLINICAL DATA: Chest tube.

PORTABLE CHEST - 1 VIEW

[view not recorded]
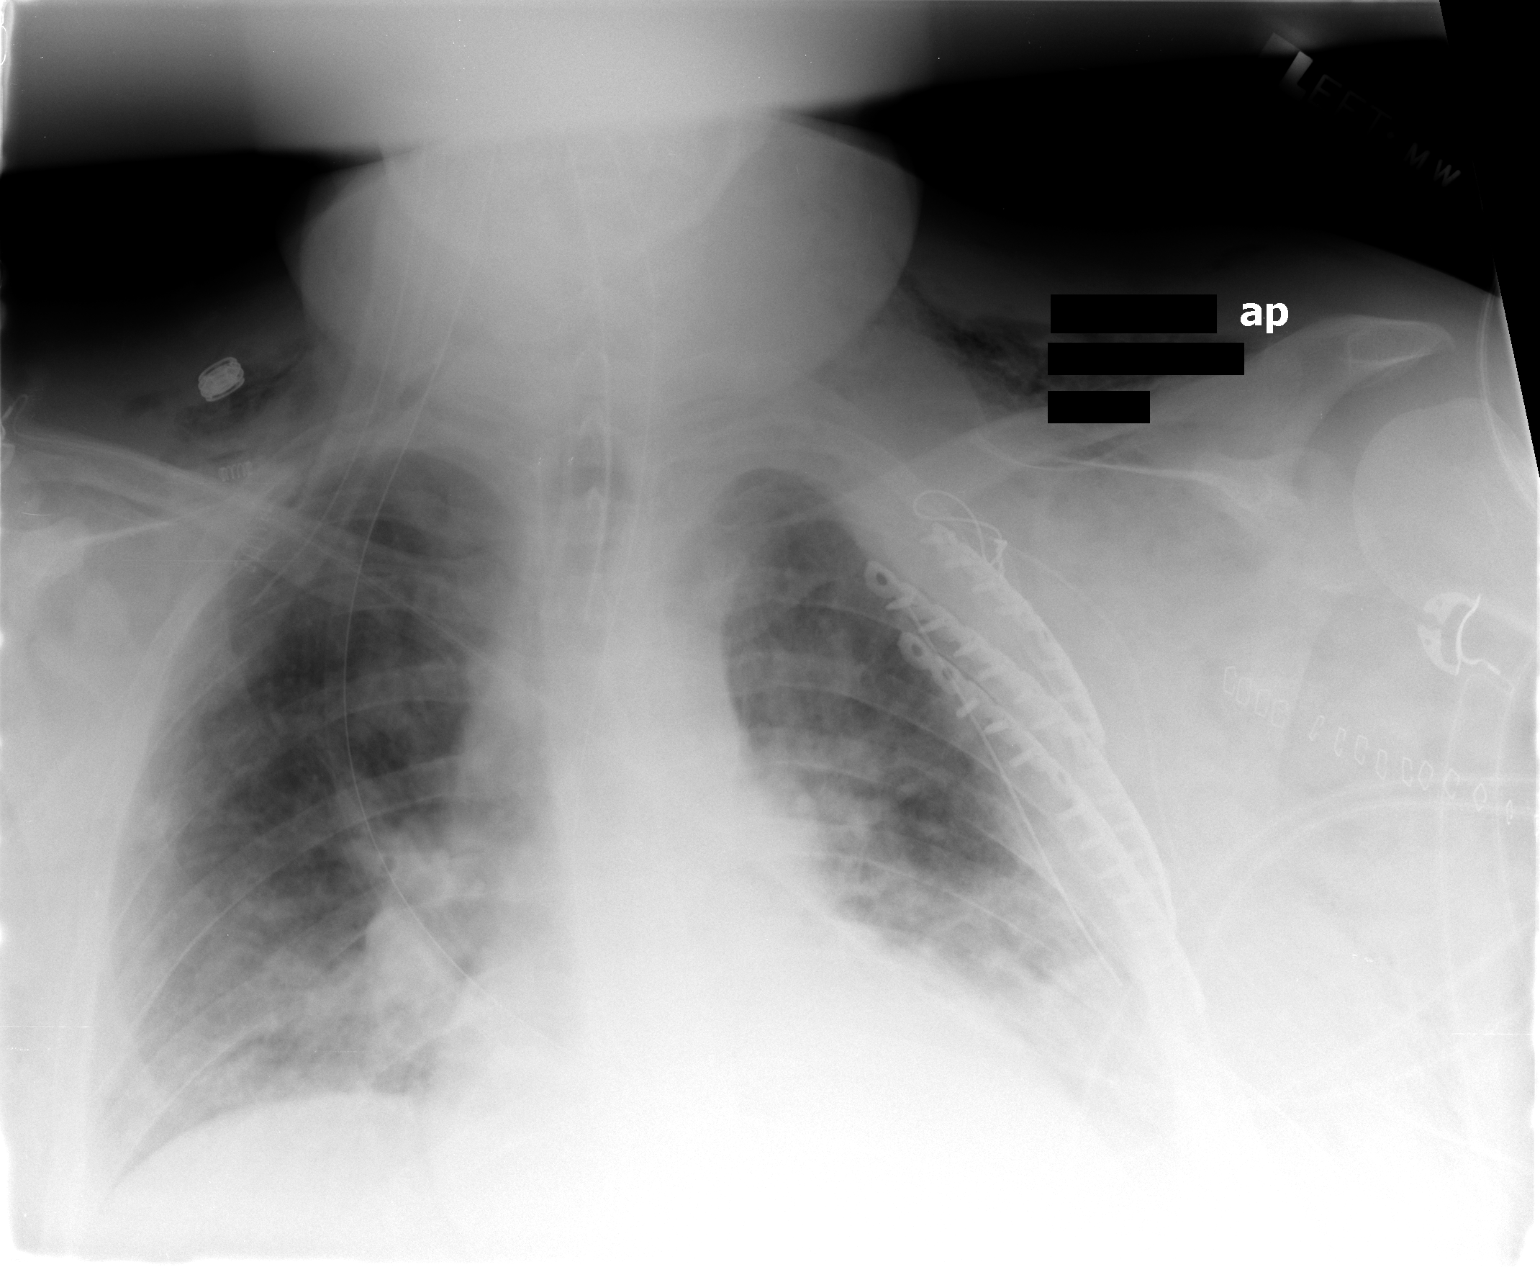

[1 of 1 positions shown; findings below may reference images not displayed]

FINDINGS: Endotracheal tube remains 4 cm above carina.  Left chest
tube remains in place.  Postoperative changes noted on the left.
There is a left pleural thickening or loculated effusion laterally.
Cardiomegaly with vascular congestion and mild pulmonary edema
noted, increased since prior study.  There is bibasilar atelectasis
and small left effusion.
IMPRESSION: Mild CHF, worsened since prior study.

Left chest tube remains in place without pneumothorax.  Small
loculated left effusion versus pleural thickening.
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
## 2013-09-20 ENCOUNTER — Encounter (HOSPITAL_COMMUNITY): Payer: Self-pay | Admitting: Emergency Medicine

## 2013-09-20 ENCOUNTER — Emergency Department (HOSPITAL_COMMUNITY)
Admission: EM | Admit: 2013-09-20 | Discharge: 2013-09-20 | Disposition: A | Discharge status: Home / Self Care | Payer: No Typology Code available for payment source | Attending: Emergency Medicine | Admitting: Emergency Medicine

## 2013-09-20 DIAGNOSIS — Z79899 Other long term (current) drug therapy: Secondary | ICD-10-CM | POA: Insufficient documentation

## 2013-09-20 DIAGNOSIS — Y9389 Activity, other specified: Secondary | ICD-10-CM | POA: Insufficient documentation

## 2013-09-20 DIAGNOSIS — Y92410 Unspecified street and highway as the place of occurrence of the external cause: Secondary | ICD-10-CM | POA: Insufficient documentation

## 2013-09-20 DIAGNOSIS — S8990XA Unspecified injury of unspecified lower leg, initial encounter: Secondary | ICD-10-CM | POA: Insufficient documentation

## 2013-09-20 DIAGNOSIS — J45909 Unspecified asthma, uncomplicated: Secondary | ICD-10-CM | POA: Insufficient documentation

## 2013-09-20 DIAGNOSIS — M436 Torticollis: Secondary | ICD-10-CM | POA: Insufficient documentation

## 2013-09-20 DIAGNOSIS — Z8659 Personal history of other mental and behavioral disorders: Secondary | ICD-10-CM | POA: Insufficient documentation

## 2013-09-20 DIAGNOSIS — Z8781 Personal history of (healed) traumatic fracture: Secondary | ICD-10-CM | POA: Insufficient documentation

## 2013-09-20 DIAGNOSIS — T148XXA Other injury of unspecified body region, initial encounter: Secondary | ICD-10-CM | POA: Insufficient documentation

## 2013-09-20 DIAGNOSIS — S99919A Unspecified injury of unspecified ankle, initial encounter: Secondary | ICD-10-CM

## 2013-09-20 DIAGNOSIS — S99929A Unspecified injury of unspecified foot, initial encounter: Secondary | ICD-10-CM

## 2013-09-20 DIAGNOSIS — F17200 Nicotine dependence, unspecified, uncomplicated: Secondary | ICD-10-CM | POA: Insufficient documentation

## 2013-09-20 MED ORDER — CYCLOBENZAPRINE HCL 10 MG PO TABS: 10.0000 mg | ORAL_TABLET | Freq: Three times a day (TID) | ORAL | Status: DC | PRN

## 2013-09-20 MED ORDER — IBUPROFEN 800 MG PO TABS: 800.0000 mg | ORAL_TABLET | Freq: Three times a day (TID) | ORAL | Status: DC
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
## 2013-09-20 NOTE — ED Provider Notes (Signed)
Medical screening examination/treatment/procedure(s) were performed by non-physician practitioner and as supervising physician I was immediately available for consultation/collaboration.  EKG Interpretation   None       Rolland Porter, MD, Abram Sander   Janice Norrie, MD 09/20/13 2302
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
## 2014-10-08 ENCOUNTER — Emergency Department (HOSPITAL_COMMUNITY)
Admission: EM | Admit: 2014-10-08 | Discharge: 2014-10-08 | Disposition: A | Discharge status: Home / Self Care | Payer: No Typology Code available for payment source | Attending: Emergency Medicine | Admitting: Emergency Medicine

## 2014-10-08 ENCOUNTER — Emergency Department (HOSPITAL_COMMUNITY): Payer: No Typology Code available for payment source

## 2014-10-08 ENCOUNTER — Encounter (HOSPITAL_COMMUNITY): Payer: Self-pay | Admitting: Emergency Medicine

## 2014-10-08 DIAGNOSIS — Y9389 Activity, other specified: Secondary | ICD-10-CM | POA: Insufficient documentation

## 2014-10-08 DIAGNOSIS — S62396A Other fracture of fifth metacarpal bone, right hand, initial encounter for closed fracture: Secondary | ICD-10-CM | POA: Insufficient documentation

## 2014-10-08 DIAGNOSIS — Y998 Other external cause status: Secondary | ICD-10-CM | POA: Insufficient documentation

## 2014-10-08 DIAGNOSIS — Y9289 Other specified places as the place of occurrence of the external cause: Secondary | ICD-10-CM | POA: Insufficient documentation

## 2014-10-08 DIAGNOSIS — Z720 Tobacco use: Secondary | ICD-10-CM | POA: Insufficient documentation

## 2014-10-08 DIAGNOSIS — S62308A Unspecified fracture of other metacarpal bone, initial encounter for closed fracture: Secondary | ICD-10-CM

## 2014-10-08 DIAGNOSIS — Z8659 Personal history of other mental and behavioral disorders: Secondary | ICD-10-CM | POA: Insufficient documentation

## 2014-10-08 DIAGNOSIS — Z79899 Other long term (current) drug therapy: Secondary | ICD-10-CM | POA: Insufficient documentation

## 2014-10-08 DIAGNOSIS — J45909 Unspecified asthma, uncomplicated: Secondary | ICD-10-CM | POA: Insufficient documentation

## 2014-10-08 DIAGNOSIS — W228XXA Striking against or struck by other objects, initial encounter: Secondary | ICD-10-CM | POA: Insufficient documentation

## 2014-10-08 MED ORDER — HYDROCODONE-ACETAMINOPHEN 5-325 MG PO TABS
2.0000 | ORAL_TABLET | Freq: Once | ORAL | Status: AC
  Administered 2014-10-08: 2 via ORAL
  Filled 2014-10-08: qty 2

## 2014-10-08 MED ORDER — OXYCODONE-ACETAMINOPHEN 5-325 MG PO TABS
2.0000 | ORAL_TABLET | Freq: Once | ORAL | Status: AC
  Administered 2014-10-08: 2 via ORAL
  Filled 2014-10-08: qty 2

## 2014-10-08 MED ORDER — HYDROCODONE-ACETAMINOPHEN 5-325 MG PO TABS: 2.0000 | ORAL_TABLET | ORAL | Status: DC | PRN

## 2014-10-08 MED ORDER — OXYCODONE-ACETAMINOPHEN 5-325 MG PO TABS: 2.0000 | ORAL_TABLET | ORAL | Status: DC | PRN

## 2014-10-08 NOTE — ED Notes (Signed)
Pt reports punching a wall with his right hand. Hand is noticeably swollen and wrist is swollen. Took two Tylenol around 1300. No other question/concerns.

## 2014-10-08 NOTE — Discharge Instructions (Signed)
Boxer's Fracture You have a break (fracture) of the fifth metacarpal bone. This is commonly called a boxer's fracture. This is the bone in the hand where the little finger attaches. The fracture is in the end of that bone, closest to the little finger. It is usually caused when you hit an object with a clenched fist. Often, the knuckle is pushed down by the impact. Sometimes, the fracture rotates out of position. A boxer's fracture will usually heal within 6 weeks, if it is treated properly and protected from re-injury. Surgery is sometimes needed. A cast, splint, or bulky hand dressing may be used to protect and immobilize a boxer's fracture. Do not remove this device or dressing until your caregiver approves. Keep your hand elevated, and apply ice packs for 15-20 minutes every 2 hours, for the first 2 days. Elevation and ice help reduce swelling and relieve pain. See your caregiver, or an orthopedic specialist, for follow-up care within the next 10 days. This is to make sure your fracture is healing properly. Document Released: 08/26/2005 Document Revised: 11/18/2011 Document Reviewed: 02/13/2007 [facility] Patient Information 2015 [place], [place]. This information is not intended to replace advice given to you by your health care provider. Make sure you discuss any questions you have with your health care provider.
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
## 2014-10-08 NOTE — ED Notes (Signed)
Ortho called 

## 2014-10-11 ENCOUNTER — Emergency Department (HOSPITAL_COMMUNITY)
Admission: EM | Admit: 2014-10-11 | Discharge: 2014-10-11 | Disposition: A | Discharge status: Home / Self Care | Payer: Self-pay | Attending: Emergency Medicine | Admitting: Emergency Medicine

## 2014-10-11 ENCOUNTER — Encounter (HOSPITAL_COMMUNITY): Payer: Self-pay | Admitting: Emergency Medicine

## 2014-10-11 DIAGNOSIS — Z720 Tobacco use: Secondary | ICD-10-CM | POA: Insufficient documentation

## 2014-10-11 DIAGNOSIS — W228XXD Striking against or struck by other objects, subsequent encounter: Secondary | ICD-10-CM | POA: Insufficient documentation

## 2014-10-11 DIAGNOSIS — Z791 Long term (current) use of non-steroidal anti-inflammatories (NSAID): Secondary | ICD-10-CM | POA: Insufficient documentation

## 2014-10-11 DIAGNOSIS — S62606D Fracture of unspecified phalanx of right little finger, subsequent encounter for fracture with routine healing: Secondary | ICD-10-CM | POA: Insufficient documentation

## 2014-10-11 DIAGNOSIS — Z8659 Personal history of other mental and behavioral disorders: Secondary | ICD-10-CM | POA: Insufficient documentation

## 2014-10-11 DIAGNOSIS — Z79899 Other long term (current) drug therapy: Secondary | ICD-10-CM | POA: Insufficient documentation

## 2014-10-11 DIAGNOSIS — S62339A Displaced fracture of neck of unspecified metacarpal bone, initial encounter for closed fracture: Secondary | ICD-10-CM

## 2014-10-11 DIAGNOSIS — J45909 Unspecified asthma, uncomplicated: Secondary | ICD-10-CM | POA: Insufficient documentation

## 2014-10-11 MED ORDER — OXYCODONE-ACETAMINOPHEN 5-325 MG PO TABS: 2.0000 | ORAL_TABLET | ORAL | Status: DC | PRN

## 2014-10-11 MED ORDER — LIDOCAINE HCL 2 % IJ SOLN
10.0000 mL | Freq: Once | INTRAMUSCULAR | Status: AC
  Administered 2014-10-11: 200 mg via INTRADERMAL
  Filled 2014-10-11: qty 20

## 2014-10-11 NOTE — ED Notes (Signed)
Dr Gramig at bedside. 

## 2014-10-11 NOTE — Discharge Instructions (Signed)
Keep bandage clean and dry.  Call for any problems.  No smoking.  Criteria for driving a car: you should be off your pain medicine for 7-8 hours, able to drive one handed(confident), thinking clearly and feeling able in your judgement to drive. Continue elevation as it will decrease swelling.  If instructed by MD move your fingers within the confines of the bandage/splint.  Use ice if instructed by your MD. Call immediately for any sudden loss of feeling in your hand/arm or change in functional abilities of the extremity.   We recommend that you to take vitamin C 1000 mg a day to promote healing we also recommend that if you require her pain medicine that he take a stool softener to prevent constipation as most pain medicines will have constipation side effects. We recommend either Peri-Colace or Senokot and recommend that you also consider adding MiraLAX to prevent the constipation affects from pain medicine if you are required to use them. These medicines are over the counter and maybe purchased at a local pharmacy.
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
## 2014-10-11 NOTE — Progress Notes (Addendum)
Orthopedic Tech Progress Note Patient Details:  [name] 12-Jan-1976 [id number]  Casting Type of Cast: Short arm cast Cast Location: RUE Cast Material: Fiberglass Cast Intervention: Application    Ortho Devices Type of Ortho Device: Arm sling Ortho Device/Splint Location: RUE Ortho Device/Splint Interventions: Ordered, Application   Braulio Bosch 10/11/2014, 6:53 PM Ordered by Dr. Amedeo Plenty
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
## 2017-05-29 ENCOUNTER — Encounter (HOSPITAL_COMMUNITY): Payer: Self-pay

## 2017-05-29 ENCOUNTER — Emergency Department (HOSPITAL_COMMUNITY)
Admission: EM | Admit: 2017-05-29 | Discharge: 2017-05-29 | Disposition: A | Discharge status: Home / Self Care | Payer: Self-pay | Attending: Emergency Medicine | Admitting: Emergency Medicine

## 2017-05-29 DIAGNOSIS — Z79899 Other long term (current) drug therapy: Secondary | ICD-10-CM | POA: Insufficient documentation

## 2017-05-29 DIAGNOSIS — J45909 Unspecified asthma, uncomplicated: Secondary | ICD-10-CM | POA: Insufficient documentation

## 2017-05-29 DIAGNOSIS — T401X1A Poisoning by heroin, accidental (unintentional), initial encounter: Secondary | ICD-10-CM | POA: Insufficient documentation

## 2017-05-29 DIAGNOSIS — F17200 Nicotine dependence, unspecified, uncomplicated: Secondary | ICD-10-CM | POA: Insufficient documentation

## 2017-05-29 LAB — COMPREHENSIVE METABOLIC PANEL
ALBUMIN: 4.1 g/dL (ref 3.5–5.0)
ALT: 25 U/L (ref 17–63)
AST: 24 U/L (ref 15–41)
Alkaline Phosphatase: 70 U/L (ref 38–126)
Anion gap: 8 (ref 5–15)
BUN: 17 mg/dL (ref 6–20)
CHLORIDE: 108 mmol/L (ref 101–111)
CO2: 24 mmol/L (ref 22–32)
Calcium: 9.1 mg/dL (ref 8.9–10.3)
Creatinine, Ser: 1.02 mg/dL (ref 0.61–1.24)
GFR calc Af Amer: >60 mL/min (ref >60)
GFR calc non Af Amer: >60 mL/min (ref >60)
Glucose, Bld: 99 mg/dL (ref 65–99)
POTASSIUM: 3.9 mmol/L (ref 3.5–5.1)
SODIUM: 140 mmol/L (ref 135–145)
Total Bilirubin: 0.8 mg/dL (ref 0.3–1.2)
Total Protein: 7.8 g/dL (ref 6.5–8.1)

## 2017-05-29 LAB — CBC WITH DIFFERENTIAL/PLATELET
BASOS ABS: 0 10*3/uL (ref 0.0–0.1)
BASOS PCT: 0 %
Eosinophils Absolute: 0.1 10*3/uL (ref 0.0–0.7)
Eosinophils Relative: 1 %
HEMATOCRIT: 51.4 % (ref 39.0–52.0)
HEMOGLOBIN: 17.7 g/dL — AB (ref 13.0–17.0)
LYMPHS PCT: 6 %
Lymphs Abs: 0.9 10*3/uL (ref 0.7–4.0)
MCH: 29.4 pg (ref 26.0–34.0)
MCHC: 34.4 g/dL (ref 30.0–36.0)
MCV: 85.4 fL (ref 78.0–100.0)
MONO ABS: 1 10*3/uL (ref 0.1–1.0)
Monocytes Relative: 7 %
NEUTROS PCT: 86 %
Neutro Abs: 11.9 10*3/uL — ABNORMAL HIGH (ref 1.7–7.7)
Platelets: 249 10*3/uL (ref 150–400)
RBC: 6.02 MIL/uL — AB (ref 4.22–5.81)
RDW: 13.6 % (ref 11.5–15.5)
WBC: 13.9 10*3/uL — AB (ref 4.0–10.5)

## 2017-05-29 LAB — SALICYLATE LEVEL: Salicylate Lvl: <7 mg/dL (ref 2.8–30.0)

## 2017-05-29 LAB — ACETAMINOPHEN LEVEL: Acetaminophen (Tylenol), Serum: <10 ug/mL — ABNORMAL LOW (ref 10–30)

## 2017-05-29 LAB — ETHANOL

## 2017-05-29 LAB — CBG MONITORING, ED: Glucose-Capillary: 100 mg/dL — ABNORMAL HIGH (ref 65–99)

## 2017-05-29 MED ORDER — ONDANSETRON HCL 4 MG/2ML IJ SOLN
4.0000 mg | Freq: Once | INTRAMUSCULAR | Status: AC
  Administered 2017-05-29: 4 mg via INTRAVENOUS
  Filled 2017-05-29: qty 2
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
## 2017-05-29 NOTE — Discharge Instructions (Signed)
There is a chance that when the narcan wears off, you can go back into an overdose state that could be life threatening.  Please return without fail for worsening symptoms, including unresponsiveness, difficulty breathing, confusion or any other symptoms concerning to you.
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
## 2017-05-29 NOTE — ED Notes (Signed)
Pt is made aware of Urine sample. Is not able to urinate

## 2017-05-29 NOTE — ED Notes (Signed)
ED Provider at bedside. 

## 2019-07-22 ENCOUNTER — Inpatient Hospital Stay (HOSPITAL_COMMUNITY): Payer: Self-pay

## 2019-07-22 ENCOUNTER — Inpatient Hospital Stay (HOSPITAL_COMMUNITY)
Admission: EM | Admit: 2019-07-22 | Discharge: 2019-08-01 | DRG: 870 | Discharge status: Against Medical Advice | Payer: Self-pay | Attending: Internal Medicine | Admitting: Internal Medicine

## 2019-07-22 ENCOUNTER — Emergency Department (HOSPITAL_COMMUNITY): Payer: Self-pay

## 2019-07-22 ENCOUNTER — Other Ambulatory Visit: Payer: Self-pay

## 2019-07-22 ENCOUNTER — Encounter (HOSPITAL_COMMUNITY): Payer: Self-pay | Admitting: Emergency Medicine

## 2019-07-22 DIAGNOSIS — J189 Pneumonia, unspecified organism: Secondary | ICD-10-CM

## 2019-07-22 DIAGNOSIS — Z4659 Encounter for fitting and adjustment of other gastrointestinal appliance and device: Secondary | ICD-10-CM

## 2019-07-22 DIAGNOSIS — R739 Hyperglycemia, unspecified: Secondary | ICD-10-CM | POA: Diagnosis present

## 2019-07-22 DIAGNOSIS — J4552 Severe persistent asthma with status asthmaticus: Secondary | ICD-10-CM

## 2019-07-22 DIAGNOSIS — J9600 Acute respiratory failure, unspecified whether with hypoxia or hypercapnia: Secondary | ICD-10-CM | POA: Diagnosis present

## 2019-07-22 DIAGNOSIS — Z9911 Dependence on respirator [ventilator] status: Secondary | ICD-10-CM

## 2019-07-22 DIAGNOSIS — I10 Essential (primary) hypertension: Secondary | ICD-10-CM | POA: Diagnosis present

## 2019-07-22 DIAGNOSIS — Z5329 Procedure and treatment not carried out because of patient's decision for other reasons: Secondary | ICD-10-CM | POA: Diagnosis present

## 2019-07-22 DIAGNOSIS — J158 Pneumonia due to other specified bacteria: Secondary | ICD-10-CM | POA: Diagnosis present

## 2019-07-22 DIAGNOSIS — R12 Heartburn: Secondary | ICD-10-CM | POA: Diagnosis not present

## 2019-07-22 DIAGNOSIS — E875 Hyperkalemia: Secondary | ICD-10-CM | POA: Diagnosis not present

## 2019-07-22 DIAGNOSIS — G92 Toxic encephalopathy: Secondary | ICD-10-CM | POA: Diagnosis present

## 2019-07-22 DIAGNOSIS — F17200 Nicotine dependence, unspecified, uncomplicated: Secondary | ICD-10-CM | POA: Diagnosis present

## 2019-07-22 DIAGNOSIS — R6521 Severe sepsis with septic shock: Secondary | ICD-10-CM | POA: Diagnosis present

## 2019-07-22 DIAGNOSIS — R1310 Dysphagia, unspecified: Secondary | ICD-10-CM

## 2019-07-22 DIAGNOSIS — F1210 Cannabis abuse, uncomplicated: Secondary | ICD-10-CM | POA: Diagnosis present

## 2019-07-22 DIAGNOSIS — A4189 Other specified sepsis: Principal | ICD-10-CM | POA: Diagnosis present

## 2019-07-22 DIAGNOSIS — N179 Acute kidney failure, unspecified: Secondary | ICD-10-CM

## 2019-07-22 DIAGNOSIS — Z825 Family history of asthma and other chronic lower respiratory diseases: Secondary | ICD-10-CM

## 2019-07-22 DIAGNOSIS — Z20828 Contact with and (suspected) exposure to other viral communicable diseases: Secondary | ICD-10-CM | POA: Diagnosis present

## 2019-07-22 DIAGNOSIS — Z8249 Family history of ischemic heart disease and other diseases of the circulatory system: Secondary | ICD-10-CM

## 2019-07-22 DIAGNOSIS — J9690 Respiratory failure, unspecified, unspecified whether with hypoxia or hypercapnia: Secondary | ICD-10-CM

## 2019-07-22 DIAGNOSIS — Z882 Allergy status to sulfonamides status: Secondary | ICD-10-CM

## 2019-07-22 DIAGNOSIS — F1110 Opioid abuse, uncomplicated: Secondary | ICD-10-CM | POA: Diagnosis present

## 2019-07-22 DIAGNOSIS — T43621A Poisoning by amphetamines, accidental (unintentional), initial encounter: Secondary | ICD-10-CM | POA: Diagnosis present

## 2019-07-22 DIAGNOSIS — S0012XA Contusion of left eyelid and periocular area, initial encounter: Secondary | ICD-10-CM | POA: Diagnosis present

## 2019-07-22 DIAGNOSIS — T401X1A Poisoning by heroin, accidental (unintentional), initial encounter: Secondary | ICD-10-CM | POA: Diagnosis present

## 2019-07-22 DIAGNOSIS — E870 Hyperosmolality and hypernatremia: Secondary | ICD-10-CM | POA: Diagnosis not present

## 2019-07-22 DIAGNOSIS — E162 Hypoglycemia, unspecified: Secondary | ICD-10-CM | POA: Diagnosis not present

## 2019-07-22 DIAGNOSIS — Z09 Encounter for follow-up examination after completed treatment for conditions other than malignant neoplasm: Secondary | ICD-10-CM

## 2019-07-22 DIAGNOSIS — Z6841 Body Mass Index (BMI) 40.0 and over, adult: Secondary | ICD-10-CM

## 2019-07-22 DIAGNOSIS — J9602 Acute respiratory failure with hypercapnia: Secondary | ICD-10-CM | POA: Diagnosis present

## 2019-07-22 DIAGNOSIS — Z885 Allergy status to narcotic agent status: Secondary | ICD-10-CM

## 2019-07-22 DIAGNOSIS — F1510 Other stimulant abuse, uncomplicated: Secondary | ICD-10-CM | POA: Diagnosis present

## 2019-07-22 DIAGNOSIS — Z452 Encounter for adjustment and management of vascular access device: Secondary | ICD-10-CM

## 2019-07-22 DIAGNOSIS — J45901 Unspecified asthma with (acute) exacerbation: Secondary | ICD-10-CM | POA: Diagnosis present

## 2019-07-22 DIAGNOSIS — J9601 Acute respiratory failure with hypoxia: Secondary | ICD-10-CM | POA: Diagnosis present

## 2019-07-22 DIAGNOSIS — R0902 Hypoxemia: Secondary | ICD-10-CM

## 2019-07-22 DIAGNOSIS — R6520 Severe sepsis without septic shock: Secondary | ICD-10-CM | POA: Diagnosis present

## 2019-07-22 DIAGNOSIS — Z01818 Encounter for other preprocedural examination: Secondary | ICD-10-CM

## 2019-07-22 DIAGNOSIS — R140 Abdominal distension (gaseous): Secondary | ICD-10-CM

## 2019-07-22 DIAGNOSIS — X58XXXA Exposure to other specified factors, initial encounter: Secondary | ICD-10-CM | POA: Diagnosis present

## 2019-07-22 DIAGNOSIS — N170 Acute kidney failure with tubular necrosis: Secondary | ICD-10-CM | POA: Diagnosis not present

## 2019-07-22 LAB — BLOOD GAS, ARTERIAL
Acid-base deficit: 4.4 mmol/L — ABNORMAL HIGH (ref 0.0–2.0)
Acid-base deficit: 5.9 mmol/L — ABNORMAL HIGH (ref 0.0–2.0)
Bicarbonate: 26.5 mmol/L (ref 20.0–28.0)
Bicarbonate: 24.7 mmol/L (ref 20.0–28.0)
Drawn by: 252031
Drawn by: 25203
FIO2: 100
FIO2: 0.8
MECHVT: 600 mL
MECHVT: 520 mL
O2 Saturation: 98.3 %
O2 Saturation: 95.4 %
PEEP: 5 cmH2O
PEEP: 8 cmH2O
Patient temperature: 37
Patient temperature: 37
RATE: 16 resp/min
RATE: 26 resp/min
pCO2 arterial: 119 mmHg (ref 32.0–48.0)
pCO2 arterial: 110 mmHg (ref 32.0–48.0)
pH, Arterial: 6.979 — CL (ref 7.350–7.450)
pH, Arterial: 6.982 — CL (ref 7.350–7.450)
pO2, Arterial: 319 mmHg — ABNORMAL HIGH (ref 83.0–108.0)
pO2, Arterial: 121 mmHg — ABNORMAL HIGH (ref 83.0–108.0)

## 2019-07-22 LAB — POCT I-STAT 7, (LYTES, BLD GAS, ICA,H+H)
Acid-base deficit: 11 mmol/L — ABNORMAL HIGH (ref 0.0–2.0)
Acid-base deficit: 11 mmol/L — ABNORMAL HIGH (ref 0.0–2.0)
Bicarbonate: 20.8 mmol/L (ref 20.0–28.0)
Bicarbonate: 19.9 mmol/L — ABNORMAL LOW (ref 20.0–28.0)
Calcium, Ion: 1.18 mmol/L (ref 1.15–1.40)
Calcium, Ion: 1.17 mmol/L (ref 1.15–1.40)
HCT: 51 % (ref 39.0–52.0)
HCT: 51 % (ref 39.0–52.0)
Hemoglobin: 17.3 g/dL — ABNORMAL HIGH (ref 13.0–17.0)
Hemoglobin: 17.3 g/dL — ABNORMAL HIGH (ref 13.0–17.0)
O2 Saturation: 94 %
O2 Saturation: 94 %
Patient temperature: 98.7
Patient temperature: 98.5
Potassium: 3.3 mmol/L — ABNORMAL LOW (ref 3.5–5.1)
Potassium: 3.3 mmol/L — ABNORMAL LOW (ref 3.5–5.1)
Sodium: 143 mmol/L (ref 135–145)
Sodium: 142 mmol/L (ref 135–145)
TCO2: 23 mmol/L (ref 22–32)
TCO2: 22 mmol/L (ref 22–32)
pCO2 arterial: 73.2 mmHg (ref 32.0–48.0)
pCO2 arterial: 61.9 mmHg — ABNORMAL HIGH (ref 32.0–48.0)
pH, Arterial: 7.061 — CL (ref 7.350–7.450)
pH, Arterial: 7.114 — CL (ref 7.350–7.450)
pO2, Arterial: 104 mmHg (ref 83.0–108.0)
pO2, Arterial: 94 mmHg (ref 83.0–108.0)

## 2019-07-22 LAB — URINALYSIS, COMPLETE (UACMP) WITH MICROSCOPIC
Bilirubin Urine: NEGATIVE
Glucose, UA: 50 mg/dL — AB
Ketones, ur: NEGATIVE mg/dL
Leukocytes,Ua: NEGATIVE
Nitrite: NEGATIVE
Protein, ur: 100 mg/dL — AB
Specific Gravity, Urine: 1.014 (ref 1.005–1.030)
pH: 5 (ref 5.0–8.0)

## 2019-07-22 LAB — CRYPTOCOCCAL ANTIGEN, CSF: Crypto Ag: NEGATIVE

## 2019-07-22 LAB — URINALYSIS, ROUTINE W REFLEX MICROSCOPIC
Bilirubin Urine: NEGATIVE
Glucose, UA: NEGATIVE mg/dL
Ketones, ur: NEGATIVE mg/dL
Leukocytes,Ua: NEGATIVE
Nitrite: NEGATIVE
Protein, ur: 30 mg/dL — AB
Specific Gravity, Urine: 1.008 (ref 1.005–1.030)
pH: 5 (ref 5.0–8.0)

## 2019-07-22 LAB — COMPREHENSIVE METABOLIC PANEL
ALT: 58 U/L — ABNORMAL HIGH (ref 0–44)
AST: 63 U/L — ABNORMAL HIGH (ref 15–41)
Albumin: 3.9 g/dL (ref 3.5–5.0)
Alkaline Phosphatase: 99 U/L (ref 38–126)
Anion gap: 12 (ref 5–15)
BUN: 21 mg/dL — ABNORMAL HIGH (ref 6–20)
CO2: 24 mmol/L (ref 22–32)
Calcium: 8.9 mg/dL (ref 8.9–10.3)
Chloride: 103 mmol/L (ref 98–111)
Creatinine, Ser: 1.74 mg/dL — ABNORMAL HIGH (ref 0.61–1.24)
GFR calc Af Amer: 54 mL/min — ABNORMAL LOW (ref >60)
GFR calc non Af Amer: 47 mL/min — ABNORMAL LOW (ref >60)
Glucose, Bld: 271 mg/dL — ABNORMAL HIGH (ref 70–99)
Potassium: 4.7 mmol/L (ref 3.5–5.1)
Sodium: 139 mmol/L (ref 135–145)
Total Bilirubin: 0.4 mg/dL (ref 0.3–1.2)
Total Protein: 8 g/dL (ref 6.5–8.1)

## 2019-07-22 LAB — RESPIRATORY PANEL BY PCR

## 2019-07-22 LAB — CBC WITH DIFFERENTIAL/PLATELET
Abs Immature Granulocytes: 0.29 10*3/uL — ABNORMAL HIGH (ref 0.00–0.07)
Basophils Absolute: 0.1 10*3/uL (ref 0.0–0.1)
Basophils Relative: 0 %
Eosinophils Absolute: 0.3 10*3/uL (ref 0.0–0.5)
Eosinophils Relative: 1 %
HCT: 58.7 % — ABNORMAL HIGH (ref 39.0–52.0)
Hemoglobin: 17.8 g/dL — ABNORMAL HIGH (ref 13.0–17.0)
Immature Granulocytes: 1 %
Lymphocytes Relative: 10 %
Lymphs Abs: 2.5 10*3/uL (ref 0.7–4.0)
MCH: 28.3 pg (ref 26.0–34.0)
MCHC: 30.3 g/dL (ref 30.0–36.0)
MCV: 93.3 fL (ref 80.0–100.0)
Monocytes Absolute: 0.3 10*3/uL (ref 0.1–1.0)
Monocytes Relative: 1 %
Neutro Abs: 20.9 10*3/uL — ABNORMAL HIGH (ref 1.7–7.7)
Neutrophils Relative %: 87 %
Platelets: 316 10*3/uL (ref 150–400)
RBC: 6.29 MIL/uL — ABNORMAL HIGH (ref 4.22–5.81)
RDW: 13.4 % (ref 11.5–15.5)
WBC: 24.4 10*3/uL — ABNORMAL HIGH (ref 4.0–10.5)
nRBC: 0 % (ref 0.0–0.2)

## 2019-07-22 LAB — RAPID URINE DRUG SCREEN, HOSP PERFORMED
Amphetamines: POSITIVE — AB
Barbiturates: NOT DETECTED
Benzodiazepines: NOT DETECTED
Cocaine: NOT DETECTED
Opiates: POSITIVE — AB
Tetrahydrocannabinol: POSITIVE — AB

## 2019-07-22 LAB — LACTIC ACID, PLASMA
Lactic Acid, Venous: 4.1 mmol/L (ref 0.5–1.9)
Lactic Acid, Venous: 6.5 mmol/L (ref 0.5–1.9)

## 2019-07-22 LAB — GLUCOSE, CAPILLARY
Glucose-Capillary: 117 mg/dL — ABNORMAL HIGH (ref 70–99)
Glucose-Capillary: 153 mg/dL — ABNORMAL HIGH (ref 70–99)
Glucose-Capillary: 156 mg/dL — ABNORMAL HIGH (ref 70–99)
Glucose-Capillary: 162 mg/dL — ABNORMAL HIGH (ref 70–99)
Glucose-Capillary: 94 mg/dL (ref 70–99)

## 2019-07-22 LAB — STREP PNEUMONIAE URINARY ANTIGEN: Strep Pneumo Urinary Antigen: POSITIVE — AB

## 2019-07-22 LAB — CSF CELL COUNT WITH DIFFERENTIAL
RBC Count, CSF: 39 /mm3 — ABNORMAL HIGH
RBC Count, CSF: 49 /mm3 — ABNORMAL HIGH
Tube #: 4
Tube #: 1
WBC, CSF: 4 /mm3 (ref 0–5)
WBC, CSF: 3 /mm3 (ref 0–5)

## 2019-07-22 LAB — HEMOGLOBIN A1C
Hgb A1c MFr Bld: 5.9 % — ABNORMAL HIGH (ref 4.8–5.6)
Mean Plasma Glucose: 122.63 mg/dL

## 2019-07-22 LAB — BRAIN NATRIURETIC PEPTIDE: B Natriuretic Peptide: 55.9 pg/mL (ref 0.0–100.0)

## 2019-07-22 LAB — MRSA PCR SCREENING: MRSA by PCR: NEGATIVE

## 2019-07-22 LAB — HIV ANTIBODY (ROUTINE TESTING W REFLEX): HIV Screen 4th Generation wRfx: NONREACTIVE

## 2019-07-22 LAB — ACETAMINOPHEN LEVEL: Acetaminophen (Tylenol), Serum: <10 ug/mL — ABNORMAL LOW (ref 10–30)

## 2019-07-22 LAB — SALICYLATE LEVEL: Salicylate Lvl: <7 mg/dL (ref 2.8–30.0)

## 2019-07-22 LAB — ETHANOL: Alcohol, Ethyl (B): <10 mg/dL (ref <10)

## 2019-07-22 LAB — SARS CORONAVIRUS 2 BY RT PCR (HOSPITAL ORDER, PERFORMED IN ~~LOC~~ HOSPITAL LAB): SARS Coronavirus 2: NEGATIVE

## 2019-07-22 LAB — GLUCOSE, CSF: Glucose, CSF: 149 mg/dL — ABNORMAL HIGH (ref 40–70)

## 2019-07-22 LAB — CREATININE, URINE, RANDOM: Creatinine, Urine: 47.33 mg/dL

## 2019-07-22 LAB — PROTEIN, CSF: Total  Protein, CSF: 51 mg/dL — ABNORMAL HIGH (ref 15–45)

## 2019-07-22 LAB — TRIGLYCERIDES: Triglycerides: 67 mg/dL (ref <150)

## 2019-07-22 LAB — CBG MONITORING, ED: Glucose-Capillary: 281 mg/dL — ABNORMAL HIGH (ref 70–99)

## 2019-07-22 LAB — MAGNESIUM: Magnesium: 2.3 mg/dL (ref 1.7–2.4)

## 2019-07-22 MED ORDER — NALOXONE HCL 2 MG/2ML IJ SOSY
PREFILLED_SYRINGE | INTRAMUSCULAR | Status: AC
  Filled 2019-07-22: qty 2

## 2019-07-22 MED ORDER — NOREPINEPHRINE 4 MG/250ML-% IV SOLN
0.0000 ug/min | INTRAVENOUS | Status: DC
  Administered 2019-07-22: 10 ug/min via INTRAVENOUS
  Administered 2019-07-22: 7 ug/min via INTRAVENOUS
  Filled 2019-07-22: qty 250

## 2019-07-22 MED ORDER — PROPOFOL 1000 MG/100ML IV EMUL
0.0000 ug/kg/min | INTRAVENOUS | Status: DC
  Administered 2019-07-22: 10 ug/kg/min via INTRAVENOUS
  Filled 2019-07-22: qty 100

## 2019-07-22 MED ORDER — ALBUTEROL SULFATE (2.5 MG/3ML) 0.083% IN NEBU
5.0000 mg | INHALATION_SOLUTION | Freq: Once | RESPIRATORY_TRACT | Status: AC
  Administered 2019-07-22: 5 mg via RESPIRATORY_TRACT

## 2019-07-22 MED ORDER — FENTANYL CITRATE (PF) 100 MCG/2ML IJ SOLN
50.0000 ug | INTRAMUSCULAR | Status: DC | PRN
  Administered 2019-07-23 – 2019-07-25 (×2): 50 ug via INTRAVENOUS

## 2019-07-22 MED ORDER — ALBUTEROL SULFATE (2.5 MG/3ML) 0.083% IN NEBU
INHALATION_SOLUTION | RESPIRATORY_TRACT | Status: AC
  Administered 2019-07-22: 2.5 mg
  Filled 2019-07-22: qty 6

## 2019-07-22 MED ORDER — IPRATROPIUM-ALBUTEROL 0.5-2.5 (3) MG/3ML IN SOLN: 3.0000 mL | Freq: Four times a day (QID) | RESPIRATORY_TRACT | Status: DC

## 2019-07-22 MED ORDER — FAMOTIDINE IN NACL 20-0.9 MG/50ML-% IV SOLN
20.0000 mg | Freq: Two times a day (BID) | INTRAVENOUS | Status: DC
  Administered 2019-07-22 – 2019-07-26 (×9): 20 mg via INTRAVENOUS
  Filled 2019-07-22 (×9): qty 50

## 2019-07-22 MED ORDER — VANCOMYCIN HCL 10 G IV SOLR
2500.0000 mg | Freq: Once | INTRAVENOUS | Status: AC
  Administered 2019-07-22: 2500 mg via INTRAVENOUS
  Filled 2019-07-22: qty 2500

## 2019-07-22 MED ORDER — SODIUM CHLORIDE 0.9 % IV SOLN
0.0000 ug/kg/min | INTRAVENOUS | Status: DC
  Administered 2019-07-22: 3 ug/kg/min via INTRAVENOUS
  Administered 2019-07-23: 1 ug/kg/min via INTRAVENOUS
  Filled 2019-07-22 (×2): qty 20

## 2019-07-22 MED ORDER — FENTANYL CITRATE (PF) 100 MCG/2ML IJ SOLN
50.0000 ug | Freq: Once | INTRAMUSCULAR | Status: AC
  Administered 2019-07-22: 50 ug via INTRAVENOUS
  Filled 2019-07-22: qty 2

## 2019-07-22 MED ORDER — SODIUM CHLORIDE 0.9 % IV SOLN
500.0000 mg | INTRAVENOUS | Status: DC
  Filled 2019-07-22: qty 500

## 2019-07-22 MED ORDER — FENTANYL CITRATE (PF) 100 MCG/2ML IJ SOLN
50.0000 ug | INTRAMUSCULAR | Status: DC | PRN
  Administered 2019-07-23 – 2019-07-25 (×6): 100 ug via INTRAVENOUS
  Administered 2019-07-26: 03:00:00 150 ug via INTRAVENOUS
  Filled 2019-07-22 (×2): qty 2

## 2019-07-22 MED ORDER — FENTANYL CITRATE (PF) 100 MCG/2ML IJ SOLN: 100.0000 ug | INTRAMUSCULAR | Status: DC | PRN

## 2019-07-22 MED ORDER — FENTANYL BOLUS VIA INFUSION
50.0000 ug | INTRAVENOUS | Status: DC | PRN
  Administered 2019-07-23 – 2019-07-25 (×9): 50 ug via INTRAVENOUS
  Filled 2019-07-22: qty 50

## 2019-07-22 MED ORDER — PROPOFOL 1000 MG/100ML IV EMUL
0.0000 ug/kg/min | INTRAVENOUS | Status: DC
  Administered 2019-07-22: 5 ug/kg/min via INTRAVENOUS

## 2019-07-22 MED ORDER — SODIUM CHLORIDE 0.9 % IV BOLUS
1000.0000 mL | Freq: Once | INTRAVENOUS | Status: AC
  Administered 2019-07-22: 1000 mL via INTRAVENOUS

## 2019-07-22 MED ORDER — MIDAZOLAM HCL 2 MG/2ML IJ SOLN: 2.0000 mg | INTRAMUSCULAR | Status: DC | PRN

## 2019-07-22 MED ORDER — CHLORHEXIDINE GLUCONATE CLOTH 2 % EX PADS
6.0000 | MEDICATED_PAD | Freq: Every day | CUTANEOUS | Status: DC
  Administered 2019-07-22 – 2019-07-31 (×10): 6 via TOPICAL

## 2019-07-22 MED ORDER — FENTANYL 2500MCG IN NS 250ML (10MCG/ML) PREMIX INFUSION
50.0000 ug/h | INTRAVENOUS | Status: DC
  Administered 2019-07-22: 100 ug/h via INTRAVENOUS
  Administered 2019-07-24: 12:00:00 300 ug/h via INTRAVENOUS
  Administered 2019-07-24: 23:00:00 200 ug/h via INTRAVENOUS
  Administered 2019-07-25 – 2019-07-26 (×3): 300 ug/h via INTRAVENOUS
  Filled 2019-07-22 (×7): qty 250

## 2019-07-22 MED ORDER — INSULIN ASPART 100 UNIT/ML ~~LOC~~ SOLN
0.0000 [IU] | SUBCUTANEOUS | Status: DC
  Administered 2019-07-22 – 2019-07-23 (×4): 4 [IU] via SUBCUTANEOUS
  Administered 2019-07-23: 3 [IU] via SUBCUTANEOUS
  Administered 2019-07-23: 4 [IU] via SUBCUTANEOUS
  Administered 2019-07-23: 17:00:00 3 [IU] via SUBCUTANEOUS
  Administered 2019-07-23: 12:00:00 4 [IU] via SUBCUTANEOUS
  Administered 2019-07-24 (×3): 3 [IU] via SUBCUTANEOUS

## 2019-07-22 MED ORDER — PROPOFOL 1000 MG/100ML IV EMUL
INTRAVENOUS | Status: AC
  Filled 2019-07-22: qty 100

## 2019-07-22 MED ORDER — PRO-STAT SUGAR FREE PO LIQD
30.0000 mL | Freq: Two times a day (BID) | ORAL | Status: DC
  Administered 2019-07-22 (×2): 30 mL
  Filled 2019-07-22 (×2): qty 30

## 2019-07-22 MED ORDER — NALOXONE HCL 2 MG/2ML IJ SOSY
2.0000 mg | PREFILLED_SYRINGE | Freq: Once | INTRAMUSCULAR | Status: AC
  Administered 2019-07-22: 04:00:00 via INTRAVENOUS

## 2019-07-22 MED ORDER — IPRATROPIUM-ALBUTEROL 0.5-2.5 (3) MG/3ML IN SOLN
3.0000 mL | RESPIRATORY_TRACT | Status: DC
  Administered 2019-07-22 – 2019-07-23 (×7): 3 mL via RESPIRATORY_TRACT
  Filled 2019-07-22 (×6): qty 3

## 2019-07-22 MED ORDER — CISATRACURIUM BOLUS VIA INFUSION
0.1000 mg/kg | Freq: Once | INTRAVENOUS | Status: AC
  Administered 2019-07-22: 15.5 mg via INTRAVENOUS
  Filled 2019-07-22: qty 16

## 2019-07-22 MED ORDER — ARTIFICIAL TEARS OPHTHALMIC OINT
1.0000 "application " | TOPICAL_OINTMENT | Freq: Three times a day (TID) | OPHTHALMIC | Status: DC
  Administered 2019-07-22 – 2019-07-23 (×5): 1 via OPHTHALMIC
  Filled 2019-07-22: qty 3.5

## 2019-07-22 MED ORDER — HEPARIN SODIUM (PORCINE) 5000 UNIT/ML IJ SOLN
5000.0000 [IU] | Freq: Three times a day (TID) | INTRAMUSCULAR | Status: DC
  Administered 2019-07-22 – 2019-08-01 (×30): 5000 [IU] via SUBCUTANEOUS
  Filled 2019-07-22 (×30): qty 1

## 2019-07-22 MED ORDER — FENTANYL CITRATE (PF) 100 MCG/2ML IJ SOLN: 200.0000 ug | Freq: Once | INTRAMUSCULAR | Status: AC

## 2019-07-22 MED ORDER — ETOMIDATE 2 MG/ML IV SOLN
30.0000 mg | Freq: Once | INTRAVENOUS | Status: AC
  Administered 2019-07-22: 30 mg via INTRAVENOUS

## 2019-07-22 MED ORDER — DEXTROSE 5 % IV SOLN
10.0000 mg/kg | Freq: Once | INTRAVENOUS | Status: AC
  Administered 2019-07-22: 1070 mg via INTRAVENOUS
  Filled 2019-07-22: qty 10

## 2019-07-22 MED ORDER — SODIUM BICARBONATE 8.4 % IV SOLN
INTRAVENOUS | Status: DC
  Filled 2019-07-22: qty 150

## 2019-07-22 MED ORDER — CHLORHEXIDINE GLUCONATE 0.12% ORAL RINSE (MEDLINE KIT)
15.0000 mL | Freq: Two times a day (BID) | OROMUCOSAL | Status: DC
  Administered 2019-07-22 – 2019-07-26 (×9): 15 mL via OROMUCOSAL

## 2019-07-22 MED ORDER — SODIUM CHLORIDE 0.9 % IV SOLN
500.0000 mg | Freq: Once | INTRAVENOUS | Status: AC
  Administered 2019-07-22: 500 mg via INTRAVENOUS
  Filled 2019-07-22: qty 500

## 2019-07-22 MED ORDER — ALBUTEROL SULFATE (2.5 MG/3ML) 0.083% IN NEBU
INHALATION_SOLUTION | RESPIRATORY_TRACT | Status: AC
  Administered 2019-07-22: 5 mg
  Filled 2019-07-22: qty 6

## 2019-07-22 MED ORDER — SODIUM CHLORIDE 0.9 % IV SOLN
1.0000 g | Freq: Once | INTRAVENOUS | Status: AC
  Administered 2019-07-22: 1 g via INTRAVENOUS
  Filled 2019-07-22: qty 10

## 2019-07-22 MED ORDER — SODIUM CHLORIDE 0.9 % IV SOLN
2.0000 g | INTRAVENOUS | Status: DC
  Administered 2019-07-23 – 2019-07-24 (×2): 2 g via INTRAVENOUS
  Filled 2019-07-22 (×2): qty 20

## 2019-07-22 MED ORDER — SODIUM CHLORIDE 0.9 % IV SOLN
INTRAVENOUS | Status: DC | PRN
  Administered 2019-07-22: 13:00:00 via INTRA_ARTERIAL

## 2019-07-22 MED ORDER — ROCURONIUM BROMIDE 10 MG/ML (PF) SYRINGE
PREFILLED_SYRINGE | INTRAVENOUS | Status: AC
  Administered 2019-07-22: 100 mg
  Filled 2019-07-22: qty 10

## 2019-07-22 MED ORDER — SODIUM BICARBONATE-DEXTROSE 150-5 MEQ/L-% IV SOLN
150.0000 meq | INTRAVENOUS | Status: AC
  Administered 2019-07-22: 150 meq via INTRAVENOUS
  Filled 2019-07-22: qty 1000

## 2019-07-22 MED ORDER — ROCURONIUM BROMIDE 50 MG/5ML IV SOLN
175.0000 mg | Freq: Once | INTRAVENOUS | Status: AC
  Administered 2019-07-22: 150 mg via INTRAVENOUS
  Filled 2019-07-22: qty 17.5

## 2019-07-22 MED ORDER — NOREPINEPHRINE 4 MG/250ML-% IV SOLN
INTRAVENOUS | Status: AC
  Filled 2019-07-22: qty 250

## 2019-07-22 MED ORDER — LACTATED RINGERS IV BOLUS
1000.0000 mL | Freq: Once | INTRAVENOUS | Status: AC
  Administered 2019-07-22: 1000 mL via INTRAVENOUS

## 2019-07-22 MED ORDER — ORAL CARE MOUTH RINSE
15.0000 mL | OROMUCOSAL | Status: DC
  Administered 2019-07-22 – 2019-07-26 (×40): 15 mL via OROMUCOSAL

## 2019-07-22 MED ORDER — MIDAZOLAM HCL 2 MG/2ML IJ SOLN
4.0000 mg | Freq: Once | INTRAMUSCULAR | Status: AC
  Administered 2019-07-22: 4 mg via INTRAVENOUS
  Filled 2019-07-22: qty 4

## 2019-07-22 MED ORDER — ROCURONIUM BROMIDE 50 MG/5ML IV SOLN
100.0000 mg | Freq: Once | INTRAVENOUS | Status: AC
  Filled 2019-07-22: qty 10

## 2019-07-22 MED ORDER — PROPOFOL 1000 MG/100ML IV EMUL
25.0000 ug/kg/min | INTRAVENOUS | Status: DC
  Administered 2019-07-22 (×2): 25 ug/kg/min via INTRAVENOUS
  Administered 2019-07-22 (×2): 35 ug/kg/min via INTRAVENOUS
  Administered 2019-07-22: 25 ug/kg/min via INTRAVENOUS
  Administered 2019-07-23 (×2): 30 ug/kg/min via INTRAVENOUS
  Administered 2019-07-23 (×3): 40 ug/kg/min via INTRAVENOUS
  Administered 2019-07-23: 35 ug/kg/min via INTRAVENOUS
  Administered 2019-07-23: 30 ug/kg/min via INTRAVENOUS
  Administered 2019-07-24: 45 ug/kg/min via INTRAVENOUS
  Administered 2019-07-24: 09:00:00 50 ug/kg/min via INTRAVENOUS
  Administered 2019-07-24: 45 ug/kg/min via INTRAVENOUS
  Administered 2019-07-24: 35 ug/kg/min via INTRAVENOUS
  Administered 2019-07-24: 01:00:00 40 ug/kg/min via INTRAVENOUS
  Administered 2019-07-24: 45 ug/kg/min via INTRAVENOUS
  Administered 2019-07-24: 14:00:00 35 ug/kg/min via INTRAVENOUS
  Administered 2019-07-24 – 2019-07-25 (×2): 40 ug/kg/min via INTRAVENOUS
  Administered 2019-07-25: 06:00:00 30 ug/kg/min via INTRAVENOUS
  Administered 2019-07-25: 09:00:00 50 ug/kg/min via INTRAVENOUS
  Filled 2019-07-22 (×23): qty 100

## 2019-07-22 MED ORDER — MAGNESIUM SULFATE 2 GM/50ML IV SOLN
2.0000 g | Freq: Once | INTRAVENOUS | Status: AC
  Administered 2019-07-22: 2 g via INTRAVENOUS
  Filled 2019-07-22: qty 50

## 2019-07-22 MED ORDER — VITAL HIGH PROTEIN PO LIQD
1000.0000 mL | ORAL | Status: DC
  Administered 2019-07-22 – 2019-07-23 (×2): 1000 mL

## 2019-07-22 MED ORDER — METHYLPREDNISOLONE SODIUM SUCC 125 MG IJ SOLR
80.0000 mg | Freq: Two times a day (BID) | INTRAMUSCULAR | Status: DC
  Administered 2019-07-22 – 2019-07-24 (×5): 80 mg via INTRAVENOUS
  Filled 2019-07-22 (×5): qty 2

## 2019-07-22 MED ORDER — LACTATED RINGERS IV BOLUS
2000.0000 mL | Freq: Once | INTRAVENOUS | Status: AC
  Administered 2019-07-22: 1000 mL via INTRAVENOUS

## 2019-07-22 NOTE — Procedures (Signed)
Central Venous Catheter Insertion Procedure Note [name] [id number] 10-26-1975  Procedure: Insertion of Central Venous Catheter Indications: Assessment of intravascular volume, Drug and/or fluid administration and Frequent blood sampling  Procedure Details Consent: Unable to obtain consent because of emergent medical necessity. Time Out: Verified patient identification, verified procedure, site/side was marked, verified correct patient position, special equipment/implants available, medications/allergies/relevent history reviewed, required imaging and test results available.  Performed  Maximum sterile technique was used including antiseptics, cap, gloves, gown, hand hygiene, mask and sheet. Skin prep: Chlorhexidine; local anesthetic administered A antimicrobial bonded/coated triple lumen catheter was placed in the right internal jugular vein using the Seldinger technique. Ultrasound guidance used.Yes.   Catheter placed to 17 cm. Blood aspirated via all 3 ports and then flushed x 3. Line sutured x 2 and dressing applied.  Evaluation Blood flow good Complications: No apparent complications Patient did tolerate procedure well. Chest X-ray ordered to verify placement.  CXR: pending.      [name] [name] ACNP [name] PCCM Pager [phone] till 1 pm If no answer page [phone] 07/22/2019, 10:42 AM

## 2019-07-22 NOTE — Progress Notes (Signed)
Vent changes made per arterial blood gas results Dr.Mesner aware.

## 2019-07-22 NOTE — ED Notes (Signed)
[place] pts sister, wants an update on pt status
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
## 2019-07-22 NOTE — ED Notes (Signed)
Patient returned from CT scan , ETT/OGT intact , foley catheter intact , IV sites unremarkable , NS IV and Propofol drip infusing . RT at bedside administering nebulizer treatment.

## 2019-07-22 NOTE — ED Notes (Signed)
RT and EDP at bedside preparing for intubation 

## 2019-07-22 NOTE — ED Notes (Signed)
EDP notified on elevated Lactic Acid result.
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
## 2019-07-22 NOTE — Procedures (Signed)
Arterial Catheter Insertion Procedure Note [name] [id number] 1976-01-28  Procedure: Insertion of Arterial Catheter  Indications: Blood pressure monitoring and Frequent blood sampling  Procedure Details Consent: Unable to obtain consent because of emergent medical necessity. Time Out: Verified patient identification, verified procedure, site/side was marked, verified correct patient position, special equipment/implants available, medications/allergies/relevent history reviewed, required imaging and test results available.  Performed  Maximum sterile technique was used including antiseptics, cap, gloves, gown, hand hygiene, mask and sheet. Skin prep: Chlorhexidine; local anesthetic administered 20 gauge catheter was inserted into left femoral artery using the Seldinger technique.  Evaluation Blood flow good; BP tracing good. Complications: No apparent complications. Failed attempts rt radial and rt femoral  [name] ACNP [facility] PCCM Pager [phone] till 3 pm If no answer page [phone] 07/22/2019, 12:32 PM

## 2019-07-22 NOTE — Progress Notes (Signed)
[facility] Progress Note Patient Name: [name] DOB: 25-Dec-1975 MRN: [id number]   Date of Service  07/22/2019  HPI/Events of Note  Pt is in the ED with fever, leukocytosis, altered mental status, and acute respiratory failure on the ventilator.   eICU Interventions  I requested that ED physician do an LP to exclude meningitis, I've added Pt to the PCCM list, and I notified Dr. Erskine Emery who will see the patient in the ED.        [name] 07/22/2019, 6:54 AM
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
## 2019-07-22 NOTE — Progress Notes (Signed)
Aline attempted by two RT's. NP made aware and is now at bedside.

## 2019-07-22 NOTE — ED Notes (Signed)
Attempted to reach Dad [name] [phone] no answer and unable  To leave message.

## 2019-07-22 NOTE — ED Notes (Signed)
Adult lumbar puncture tray set up at bedside .

## 2019-07-23 ENCOUNTER — Inpatient Hospital Stay (HOSPITAL_COMMUNITY): Payer: Self-pay

## 2019-07-23 DIAGNOSIS — N179 Acute kidney failure, unspecified: Secondary | ICD-10-CM

## 2019-07-23 LAB — POCT I-STAT 7, (LYTES, BLD GAS, ICA,H+H)
Acid-base deficit: 8 mmol/L — ABNORMAL HIGH (ref 0.0–2.0)
Bicarbonate: 21.2 mmol/L (ref 20.0–28.0)
Calcium, Ion: 1.1 mmol/L — ABNORMAL LOW (ref 1.15–1.40)
HCT: 46 % (ref 39.0–52.0)
Hemoglobin: 15.6 g/dL (ref 13.0–17.0)
O2 Saturation: 92 %
Patient temperature: 98.6
Potassium: 4.4 mmol/L (ref 3.5–5.1)
Sodium: 139 mmol/L (ref 135–145)
TCO2: 23 mmol/L (ref 22–32)
pCO2 arterial: 57.6 mmHg — ABNORMAL HIGH (ref 32.0–48.0)
pH, Arterial: 7.174 — CL (ref 7.350–7.450)
pO2, Arterial: 79 mmHg — ABNORMAL LOW (ref 83.0–108.0)

## 2019-07-23 LAB — COMPREHENSIVE METABOLIC PANEL
ALT: 48 U/L — ABNORMAL HIGH (ref 0–44)
AST: 45 U/L — ABNORMAL HIGH (ref 15–41)
Albumin: 2.8 g/dL — ABNORMAL LOW (ref 3.5–5.0)
Alkaline Phosphatase: 61 U/L (ref 38–126)
Anion gap: 14 (ref 5–15)
BUN: 38 mg/dL — ABNORMAL HIGH (ref 6–20)
CO2: 19 mmol/L — ABNORMAL LOW (ref 22–32)
Calcium: 7.8 mg/dL — ABNORMAL LOW (ref 8.9–10.3)
Chloride: 106 mmol/L (ref 98–111)
Creatinine, Ser: 2.97 mg/dL — ABNORMAL HIGH (ref 0.61–1.24)
GFR calc Af Amer: 29 mL/min — ABNORMAL LOW (ref >60)
GFR calc non Af Amer: 25 mL/min — ABNORMAL LOW (ref >60)
Glucose, Bld: 165 mg/dL — ABNORMAL HIGH (ref 70–99)
Potassium: 4.5 mmol/L (ref 3.5–5.1)
Sodium: 139 mmol/L (ref 135–145)
Total Bilirubin: 0.7 mg/dL (ref 0.3–1.2)
Total Protein: 6.4 g/dL — ABNORMAL LOW (ref 6.5–8.1)

## 2019-07-23 LAB — GLUCOSE, CAPILLARY
Glucose-Capillary: 119 mg/dL — ABNORMAL HIGH (ref 70–99)
Glucose-Capillary: 158 mg/dL — ABNORMAL HIGH (ref 70–99)
Glucose-Capillary: 151 mg/dL — ABNORMAL HIGH (ref 70–99)
Glucose-Capillary: 147 mg/dL — ABNORMAL HIGH (ref 70–99)
Glucose-Capillary: 157 mg/dL — ABNORMAL HIGH (ref 70–99)
Glucose-Capillary: 135 mg/dL — ABNORMAL HIGH (ref 70–99)

## 2019-07-23 LAB — CBC
HCT: 50.9 % (ref 39.0–52.0)
Hemoglobin: 15.7 g/dL (ref 13.0–17.0)
MCH: 28.2 pg (ref 26.0–34.0)
MCHC: 30.8 g/dL (ref 30.0–36.0)
MCV: 91.5 fL (ref 80.0–100.0)
Platelets: 202 10*3/uL (ref 150–400)
RBC: 5.56 MIL/uL (ref 4.22–5.81)
RDW: 13.9 % (ref 11.5–15.5)
WBC: 26.4 10*3/uL — ABNORMAL HIGH (ref 4.0–10.5)
nRBC: 0 % (ref 0.0–0.2)

## 2019-07-23 LAB — HSV DNA BY PCR (REFERENCE LAB)
HSV 1 DNA: NEGATIVE
HSV 2 DNA: NEGATIVE

## 2019-07-23 LAB — LACTIC ACID, PLASMA: Lactic Acid, Venous: 1.3 mmol/L (ref 0.5–1.9)

## 2019-07-23 LAB — SODIUM, URINE, RANDOM: Sodium, Ur: 36 mmol/L

## 2019-07-23 LAB — VDRL, CSF: VDRL Quant, CSF: NONREACTIVE

## 2019-07-23 MED ORDER — VITAL HIGH PROTEIN PO LIQD
1000.0000 mL | ORAL | Status: DC
  Administered 2019-07-23 (×3): 1000 mL

## 2019-07-23 MED ORDER — VANCOMYCIN VARIABLE DOSE PER UNSTABLE RENAL FUNCTION (PHARMACIST DOSING): Status: DC

## 2019-07-23 MED ORDER — VANCOMYCIN HCL 10 G IV SOLR
1750.0000 mg | Freq: Once | INTRAVENOUS | Status: AC
  Administered 2019-07-23: 1750 mg via INTRAVENOUS
  Filled 2019-07-23: qty 1750

## 2019-07-23 MED ORDER — PRO-STAT SUGAR FREE PO LIQD
60.0000 mL | Freq: Four times a day (QID) | ORAL | Status: DC
  Administered 2019-07-23 – 2019-07-25 (×11): 60 mL
  Filled 2019-07-23 (×11): qty 60

## 2019-07-23 MED ORDER — IPRATROPIUM-ALBUTEROL 0.5-2.5 (3) MG/3ML IN SOLN
3.0000 mL | Freq: Four times a day (QID) | RESPIRATORY_TRACT | Status: DC
  Administered 2019-07-23 – 2019-07-27 (×15): 3 mL via RESPIRATORY_TRACT
  Filled 2019-07-23 (×14): qty 3

## 2019-07-23 NOTE — Progress Notes (Signed)
Pharmacy Antibiotic Note  [name] is a 43 y.o. male admitted on 07/22/2019 with sepsis likely secondary to pneumonia.  Pharmacy has been consulted for Vancomycin dosing. AKI - Scr 2.97 S/p Vanco 2500 mg IV x 1  11/12 at 0800  Plan: Recommend Vancomycin 1750 mg IV x 1 @ 0800 today, then vanc variable dosing based on renal function. Monitor renal function, C&S, clinical status and vanc levels as needed  Height: 5\' 11"  (180.3 cm) Weight: (!) 344 lb 12.8 oz (156.4 kg) IBW/kg (Calculated) : 75.3  Temp (24hrs), Avg:98.3 F (36.8 C), Min:98.1 F (36.7 C), Max:98.7 F (37.1 C)  Recent Labs  Lab 07/22/19 0413 07/22/19 0610 07/22/19 0902 07/23/19 0316  WBC 24.4*  --   --  26.4*  CREATININE 1.74*  --   --  2.97*  LATICACIDVEN  --  4.1* 6.5*  --     Estimated Creatinine Clearance: 48.9 mL/min (A) (by C-G formula based on SCr of 2.97 mg/dL (H)).    Allergies  Allergen Reactions  . Sulfonamide Derivatives     REACTION: Unknown reaction  . Vicodin [Hydrocodone-Acetaminophen] Itching    Antimicrobials this admission: Rocephin 11/12 >>  Vanc 11/12 >>   Thank you for allowing pharmacy to be a part of this patient's care.  Alanda Slim, PharmD, [facility] Clinical Pharmacist Please see AMION for all Pharmacists' Contact Phone Numbers 07/23/2019, 6:07 AM
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
## 2019-07-23 NOTE — Progress Notes (Signed)
Family Communication Note  I called and spoke with patient's biological sister Rosanne Gutting. I have updated her a length about her brothers condition. We discussed that he remains intubated and sedated on mechanical ventilation in the ICU. I explained that he is no longer dependent on blood pressure medications, and that we have made progress by weaning him off the paralytic today with better plateau pressure on the ventilator. I explained that he is moving all extremities, but that he is not following commands as he is heavily sedated. We discussed that CT of the head done 11/12 showed no acute issues, but that we will continue to follow his neurological status closely.  I did explain that his kidney function was worrisome, and that we had done an ultrasound ( results pending)  and are monitoring labs closely. I told her if renal function continues to deteriorate, we will consult the renal physicians. She verbalized understanding of the above and had no further questions at completion of the call.  She did appreciate today's update, and would appreciate daily updates if time allows, and  if at all possible.   Contact info: Mlissa Nidiffer [phone]  Magdalen Spatz, MSN, AGACNP-BC [place] Pager # [phone] After 4 pm please call [phone] 07/23/2019 4:28 PM

## 2019-07-24 ENCOUNTER — Inpatient Hospital Stay (HOSPITAL_COMMUNITY): Payer: Self-pay

## 2019-07-24 DIAGNOSIS — N170 Acute kidney failure with tubular necrosis: Secondary | ICD-10-CM

## 2019-07-24 DIAGNOSIS — T50904A Poisoning by unspecified drugs, medicaments and biological substances, undetermined, initial encounter: Secondary | ICD-10-CM

## 2019-07-24 DIAGNOSIS — F1910 Other psychoactive substance abuse, uncomplicated: Secondary | ICD-10-CM

## 2019-07-24 DIAGNOSIS — T43625A Adverse effect of amphetamines, initial encounter: Secondary | ICD-10-CM

## 2019-07-24 LAB — CBC
HCT: 42 % (ref 39.0–52.0)
Hemoglobin: 13.4 g/dL (ref 13.0–17.0)
MCH: 28.3 pg (ref 26.0–34.0)
MCHC: 31.9 g/dL (ref 30.0–36.0)
MCV: 88.6 fL (ref 80.0–100.0)
Platelets: 170 10*3/uL (ref 150–400)
RBC: 4.74 MIL/uL (ref 4.22–5.81)
RDW: 14.1 % (ref 11.5–15.5)
WBC: 18.6 10*3/uL — ABNORMAL HIGH (ref 4.0–10.5)
nRBC: 0 % (ref 0.0–0.2)

## 2019-07-24 LAB — COMPREHENSIVE METABOLIC PANEL
ALT: 45 U/L — ABNORMAL HIGH (ref 0–44)
AST: 43 U/L — ABNORMAL HIGH (ref 15–41)
Albumin: 2.7 g/dL — ABNORMAL LOW (ref 3.5–5.0)
Alkaline Phosphatase: 54 U/L (ref 38–126)
Anion gap: 14 (ref 5–15)
BUN: 76 mg/dL — ABNORMAL HIGH (ref 6–20)
CO2: 19 mmol/L — ABNORMAL LOW (ref 22–32)
Calcium: 8.2 mg/dL — ABNORMAL LOW (ref 8.9–10.3)
Chloride: 104 mmol/L (ref 98–111)
Creatinine, Ser: 4.43 mg/dL — ABNORMAL HIGH (ref 0.61–1.24)
GFR calc Af Amer: 18 mL/min — ABNORMAL LOW (ref >60)
GFR calc non Af Amer: 15 mL/min — ABNORMAL LOW (ref >60)
Glucose, Bld: 130 mg/dL — ABNORMAL HIGH (ref 70–99)
Potassium: 4.3 mmol/L (ref 3.5–5.1)
Sodium: 137 mmol/L (ref 135–145)
Total Bilirubin: 0.6 mg/dL (ref 0.3–1.2)
Total Protein: 6.1 g/dL — ABNORMAL LOW (ref 6.5–8.1)

## 2019-07-24 LAB — CULTURE, BLOOD (ROUTINE X 2)

## 2019-07-24 LAB — POCT I-STAT 7, (LYTES, BLD GAS, ICA,H+H)
Acid-base deficit: 7 mmol/L — ABNORMAL HIGH (ref 0.0–2.0)
Bicarbonate: 20.7 mmol/L (ref 20.0–28.0)
Calcium, Ion: 1.12 mmol/L — ABNORMAL LOW (ref 1.15–1.40)
HCT: 39 % (ref 39.0–52.0)
Hemoglobin: 13.3 g/dL (ref 13.0–17.0)
O2 Saturation: 97 %
Patient temperature: 98.6
Potassium: 4.2 mmol/L (ref 3.5–5.1)
Sodium: 135 mmol/L (ref 135–145)
TCO2: 22 mmol/L (ref 22–32)
pCO2 arterial: 48.5 mmHg — ABNORMAL HIGH (ref 32.0–48.0)
pH, Arterial: 7.238 — ABNORMAL LOW (ref 7.350–7.450)
pO2, Arterial: 109 mmHg — ABNORMAL HIGH (ref 83.0–108.0)

## 2019-07-24 LAB — MAGNESIUM
Magnesium: 2.4 mg/dL (ref 1.7–2.4)
Magnesium: 2.8 mg/dL — ABNORMAL HIGH (ref 1.7–2.4)

## 2019-07-24 LAB — GLUCOSE, CAPILLARY
Glucose-Capillary: 120 mg/dL — ABNORMAL HIGH (ref 70–99)
Glucose-Capillary: 117 mg/dL — ABNORMAL HIGH (ref 70–99)
Glucose-Capillary: 126 mg/dL — ABNORMAL HIGH (ref 70–99)
Glucose-Capillary: 143 mg/dL — ABNORMAL HIGH (ref 70–99)
Glucose-Capillary: 125 mg/dL — ABNORMAL HIGH (ref 70–99)

## 2019-07-24 LAB — CK: Total CK: 1108 U/L — ABNORMAL HIGH (ref 49–397)

## 2019-07-24 LAB — PROCALCITONIN: Procalcitonin: 86.68 ng/mL

## 2019-07-24 LAB — LACTIC ACID, PLASMA: Lactic Acid, Venous: 0.9 mmol/L (ref 0.5–1.9)

## 2019-07-24 LAB — VANCOMYCIN, RANDOM: Vancomycin Rm: 31

## 2019-07-24 LAB — PHOSPHORUS: Phosphorus: 6.8 mg/dL — ABNORMAL HIGH (ref 2.5–4.6)

## 2019-07-24 MED ORDER — VITAL HIGH PROTEIN PO LIQD: 1000.0000 mL | ORAL | Status: DC

## 2019-07-24 MED ORDER — HYDRALAZINE HCL 25 MG PO TABS
25.0000 mg | ORAL_TABLET | Freq: Three times a day (TID) | ORAL | Status: DC
  Administered 2019-07-24 – 2019-07-25 (×3): 25 mg via ORAL
  Filled 2019-07-24 (×3): qty 1

## 2019-07-24 MED ORDER — MIDAZOLAM HCL 2 MG/2ML IJ SOLN
2.0000 mg | INTRAMUSCULAR | Status: DC | PRN
  Administered 2019-07-24: 15:00:00 2 mg via INTRAVENOUS

## 2019-07-24 MED ORDER — AMLODIPINE BESYLATE 5 MG PO TABS
5.0000 mg | ORAL_TABLET | Freq: Every day | ORAL | Status: DC
  Administered 2019-07-24 – 2019-07-25 (×2): 5 mg
  Filled 2019-07-24 (×2): qty 1

## 2019-07-24 MED ORDER — AMLODIPINE 1 MG/ML ORAL SUSPENSION: 5.0000 mg | Freq: Every day | ORAL | Status: DC

## 2019-07-24 MED ORDER — SODIUM CHLORIDE 0.9 % IV SOLN
3.0000 g | Freq: Three times a day (TID) | INTRAVENOUS | Status: DC
  Administered 2019-07-24 – 2019-07-26 (×6): 3 g via INTRAVENOUS
  Filled 2019-07-24 (×6): qty 8

## 2019-07-24 MED ORDER — PRO-STAT SUGAR FREE PO LIQD: 30.0000 mL | Freq: Two times a day (BID) | ORAL | Status: DC

## 2019-07-24 MED ORDER — QUETIAPINE FUMARATE 50 MG PO TABS
50.0000 mg | ORAL_TABLET | Freq: Every day | ORAL | Status: DC
  Administered 2019-07-24 – 2019-07-25 (×2): 50 mg
  Filled 2019-07-24 (×2): qty 1

## 2019-07-24 MED ORDER — MIDAZOLAM HCL 2 MG/2ML IJ SOLN
2.0000 mg | Freq: Once | INTRAMUSCULAR | Status: AC
  Administered 2019-07-24: 09:00:00 2 mg via INTRAVENOUS

## 2019-07-24 MED ORDER — LABETALOL HCL 5 MG/ML IV SOLN
10.0000 mg | INTRAVENOUS | Status: DC | PRN
  Administered 2019-07-24 – 2019-07-25 (×8): 10 mg via INTRAVENOUS
  Filled 2019-07-24 (×6): qty 4

## 2019-07-24 MED ORDER — HYDROMORPHONE HCL 2 MG PO TABS
2.0000 mg | ORAL_TABLET | ORAL | Status: DC
  Administered 2019-07-24 – 2019-07-25 (×6): 2 mg
  Filled 2019-07-24 (×7): qty 1

## 2019-07-24 MED ORDER — MIDAZOLAM HCL 2 MG/2ML IJ SOLN
INTRAMUSCULAR | Status: AC
  Administered 2019-07-24: 09:00:00 2 mg via INTRAVENOUS
  Filled 2019-07-24: qty 4

## 2019-07-24 MED ORDER — METHYLPREDNISOLONE SODIUM SUCC 40 MG IJ SOLR
40.0000 mg | Freq: Every day | INTRAMUSCULAR | Status: DC
  Administered 2019-07-25 – 2019-07-26 (×2): 40 mg via INTRAVENOUS
  Filled 2019-07-24 (×2): qty 1

## 2019-07-24 MED ORDER — MIDAZOLAM HCL 2 MG/2ML IJ SOLN: 2.0000 mg | INTRAMUSCULAR | Status: DC | PRN
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
## 2019-07-24 NOTE — Progress Notes (Signed)
NAME:  [name], MRN:  [id number], DOB:  Dec 27, 1975, LOS: 2 ADMISSION DATE:  07/22/2019, CONSULTATION DATE:  11/12 REFERRING MD:  Mesner, CHIEF COMPLAINT:  encephalopathy   Brief History   Found down in a motel, nonresponsive to narcan, hypercapniec in the ED, h/o asthma & tobacco abuse  History of present illness   [name] is a 43 year old gentleman with a history of tobacco abuse and asthma who was found down at a local motel.  He was unresponsive to Narcan administered by EMS.  He was emergently intubated in the ED.  First 2 blood gases have demonstrated significant hypercapnia and respiratory acidosis, which has been refractory to vent changes.  There is no family at bedside to give additional history.  Past Medical History  Asthma Previous pneumothoraces  Significant Hospital Events   11/12 Admission 11/12 intubation 11/12 TLC 11/12 A Line  Consults:    Procedures:  11/12 LP   Significant Diagnostic Tests:  11/12 head CT-no intracranial abnormalities, sinusitis (personally reviewed) 11/12 CT spine-no fractures,  (personally reviewed) 11/12 CXR-patchy diffuse infiltrates bilaterally,  (personally reviewed)  Micro Data:  Covid negative 11/12 blood cultures-drawn after antibiotics>> + for GPC>> 11/12 respiratory culture-after antibiotics 11/12 LP cultures 11/12 RVP negative 11/12 CSF Culture 11/12 Strep Pneumonia positive UAg  Antimicrobials:  11/12 ceftriaxone 11/12 azithromycin  Interim history/subjective:  Patient remains intubated on mechanical life support in the intensive care unit  Objective   Blood pressure (!) 157/78, pulse (!) 108, temperature 98.6 F (37 C), temperature source Axillary, resp. rate (!) 28, height 5\' 11"  (1.803 m), weight (!) 157 kg, SpO2 97 %.    Vent Mode: CPAP;PSV FiO2 (%):  [40 %] 40 % Set Rate:  [21 bmp] 21 bmp Vt Set:  [650 mL] 650 mL PEEP:  [5 cmH20-7 cmH20] 5 cmH20 Pressure Support:  [10 cmH20] 10 cmH20 Plateau  Pressure:  [14 cmH20-25 cmH20] 25 cmH20   Intake/Output Summary (Last 24 hours) at 07/24/2019 1105 Last data filed at 07/24/2019 0900 Gross per 24 hour  Intake 1583.28 ml  Output 705 ml  Net 878.28 ml   Filed Weights   07/22/19 0407 07/23/19 0321 07/24/19 0415  Weight: (!) 155 kg (!) 156.4 kg (!) 157 kg    Examination: General: Obese middle-aged man intubated, sedated, nonresponsive HENT: [place]/AT, Oral ETT, OG tube Lungs: Bilateral excursion, Coarse throughout, minimal secretions.bronchospasm Cardiovascular: S1, S2, RRR, Tachycardic Abdomen: Obese, soft, BS diminished but +, Trickle feeds at 10 cc/hr Extremities: No obvious deformities, mottling to toes , Cap refill > 3 seconds Neuro: Paralyzed, sedated, intubated Derm: Warm, no rashes, no lesions, some mottling to lower extremities  Resolved Hospital Problem list     Assessment & Plan:   Acute hypoxemic hypercarbic respiratory failure requiring intubation mechanical ventilation, patient with a history of asthma, concern for aspiration pneumonia Patient was found down at motel secondary to possible drug overdose, and urine drug screen positive for methamphetamines, opiates, marijuana.  Urine antigen positive for pneumococcus. Acute metabolic toxic encephalopathy secondary to above -Patient remains intubated predominantly due to encephalopathy and delirium which is likely related to withdrawal from the multiple medications that was found in his system/possible overdose. -Patient initially requiring significant amount of vent support and was ultimately paralyzed due to significant bronchospasm and difficulty for ventilation. -This has now been discontinued -He is still experiencing significant sedation difficulties. -We will attempt to wean from IV fentanyl, we will need to start oral opiates down the tube to help with this. -  I think it will be some time as he starts to defervesce from the methamphetamines. -Add Seroquel nightly 50  mg, last QTC 409 -Still requiring high-dose propofol, we need to start weaning this as well -We will start tapering his Solu-Medrol -Gram-positive cocci x1/4 blood cultures, speciation pending, likely contaminant, MRSA PCR negative -Start Unasyn, complete 5 additional days of antibiotics  Acute toxic metabolic encephalopathy Likely overdose, urine drug screen positive for amphetamines, opiates and marijuana. -Conservative management at this time  Sepsis due to community-acquired pneumonia, urine antigen positive for pneumococcus Chest x-ray with persistent bilateral infiltrates -Continue ceftriaxone plus vancomycin  Lactic acidosis - likely due to sepsis and hypoxia -Improved  Acute renal failure -Renal ultrasound negative no evidence of obstruction -Patient was not given contrast -Rapid decline in serum creatinine, concern for ATN versus pigment nephropathy -Lactic acid normal, has been receiving some propofol -CK checked -Still making urine -Consult placed to nephrology, spoke with Dr. Jonnie Finner  HTN Plan We will add scheduled blood pressure medications  History of tobacco abuse Polysubstance abuse   Best practice:  Diet: Tube feeds, ICU Pep therapy Pain/Anxiety/Delirium protocol (if indicated): Fentanyl, oral Dilaudid, propofol, as needed Versed VAP protocol (if indicated): Ordered DVT prophylaxis: Heparin GI prophylaxis: Pepcid Glucose control: Sliding scale insulin Mobility: Bedrest Code Status: full Family Communication: We will attempt: Update family. Disposition: ICU  Labs   CBC: Recent Labs  Lab 07/22/19 0413  07/22/19 1601 07/23/19 0316 07/23/19 0822 07/24/19 0417 07/24/19 0420  WBC 24.4*  --   --  26.4*  --   --  18.6*  NEUTROABS 20.9*  --   --   --   --   --   --   HGB 17.8*   < > 17.3* 15.7 15.6 13.3 13.4  HCT 58.7*   < > 51.0 50.9 46.0 39.0 42.0  MCV 93.3  --   --  91.5  --   --  88.6  PLT 316  --   --  202  --   --  170   < > = values in  this interval not displayed.    Basic Metabolic Panel: Recent Labs  Lab 07/22/19 0413 07/22/19 0902  07/22/19 1601 07/23/19 0316 07/23/19 0822 07/24/19 0417 07/24/19 0420  NA 139  --    < > 142 139 139 135 137  K 4.7  --    < > 3.3* 4.5 4.4 4.2 4.3  CL 103  --   --   --  106  --   --  104  CO2 24  --   --   --  19*  --   --  19*  GLUCOSE 271*  --   --   --  165*  --   --  130*  BUN 21*  --   --   --  38*  --   --  76*  CREATININE 1.74*  --   --   --  2.97*  --   --  4.43*  CALCIUM 8.9  --   --   --  7.8*  --   --  8.2*  MG  --  2.3  --   --   --   --   --  2.4   < > = values in this interval not displayed.   GFR: Estimated Creatinine Clearance: 32.8 mL/min (A) (by C-G formula based on SCr of 4.43 mg/dL (H)). Recent Labs  Lab 07/22/19 0413 07/22/19 0610 07/22/19 0902 07/23/19  [id number] 07/23/19 0847 07/24/19 0420  WBC 24.4*  --   --  26.4*  --  18.6*  LATICACIDVEN  --  4.1* 6.5*  --  1.3 0.9    Liver Function Tests: Recent Labs  Lab 07/22/19 0413 07/23/19 0316 07/24/19 0420  AST 63* 45* 43*  ALT 58* 48* 45*  ALKPHOS 99 61 54  BILITOT 0.4 0.7 0.6  PROT 8.0 6.4* 6.1*  ALBUMIN 3.9 2.8* 2.7*   No results for input(s): LIPASE, AMYLASE in the last 168 hours. No results for input(s): AMMONIA in the last 168 hours.  ABG    Component Value Date/Time   PHART 7.238 (L) 07/24/2019 0417   PCO2ART 48.5 (H) 07/24/2019 0417   PO2ART 109.0 (H) 07/24/2019 0417   HCO3 20.7 07/24/2019 0417   TCO2 22 07/24/2019 0417   ACIDBASEDEF 7.0 (H) 07/24/2019 0417   O2SAT 97.0 07/24/2019 0417     Coagulation Profile: No results for input(s): INR, PROTIME in the last 168 hours.  Cardiac Enzymes: No results for input(s): CKTOTAL, CKMB, CKMBINDEX, TROPONINI in the last 168 hours.  HbA1C: Hgb A1c MFr Bld  Date/Time Value Ref Range Status  07/22/2019 09:02 AM 5.9 (H) 4.8 - 5.6 % Final    Comment:    (NOTE) Pre diabetes:          5.7%-6.4% Diabetes:              >6.4% Glycemic  control for   <7.0% adults with diabetes     CBG: Recent Labs  Lab 07/23/19 1633 07/23/19 2101 07/23/19 2322 07/24/19 0356 07/24/19 0819  GLUCAP 147* 157* 135* 120* 117*     This patient is critically ill with multiple organ system failure; which, requires frequent high complexity decision making, assessment, support, evaluation, and titration of therapies. This was completed through the application of advanced monitoring technologies and extensive interpretation of multiple databases. During this encounter critical care time was devoted to patient care services described in this note for 38 minutes.   Garner Nash, DO [facility] 07/24/2019 11:07 AM

## 2019-07-24 NOTE — Progress Notes (Signed)
Nutrition Follow-up  RD working remotely.  DOCUMENTATION CODES:   Morbid obesity  INTERVENTION:   Tube feeding: - Continue Vital High Protein @ 20 ml/hr (480 ml/day) via OG tube - Pro-stat 60 ml QID  Note: Vital High Protein is infusing at trickle rate due to high rate of propofol (not due to medical need for trickle TF). RD accounting for lipid kcal from propofol when ordering TF.  Tube feeding regimen provides 1280 kcal, 162 grams of protein, and 401 ml of H2O.  Tube feeding regimen and current propofol provides 2508 total kcal (>100% of needs).  NUTRITION DIAGNOSIS:   Inadequate oral intake related to inability to eat as evidenced by NPO status.  Ongoing, being addressed via TF  GOAL:   Provide needs based on ASPEN/SCCM guidelines  Met via TF  MONITOR:   Vent status, Labs, Weight trends, TF tolerance, I & O's  REASON FOR ASSESSMENT:   Consult Enteral/tube feeding initiation and management  ASSESSMENT:   43 year old male who presented to the ED on 11/12 after being found down in a motel. PMH of tobacco abuse and asthma. Pt required emergent intubation in the ED. Pt found to have sepsis due to CAP.  RD consulted for TF initiation and management. Trickle TF started yesterday.  OG tube in place with TF currently infusing.  Weight up 5 lbs since admit, suspect weight gain related to positive fluid balance. EDW: 155 kg  Current TF: Vital High Protein @ 20 ml/hr, Pro-stat 60 ml QID  Spoke with RN via phone call. RN reports MD okay with TF infusing above a trickle rate. Explained need for current TF rate and regimen including Pro-stat given lipid kcal from propofol.  Patient is currently intubated on ventilator support MV: 13.9 L/min Temp (24hrs), Avg:98.4 F (36.9 C), Min:98.2 F (36.8 C), Max:98.6 F (37 C) BP (a-line): 185/95 MAP (a-line): 124  Drips: Propofol: 46.5 ml/hr (provides 1228 kcal daily from lipid) Fentanyl: 30 ml/hr  Medications reviewed  and include: SSI q 4 hours, Solu-medrol, IV Pepcid  Labs reviewed: BUN 76, creatinine 4.43, ionized calcium 1.12, elevated LFTs CBG's: 117-157 x 24 hours  UOP: 825 ml x 24 hours I/O's: +2.3 L since admit  Diet Order:   Diet Order            Diet NPO time specified  Diet effective now              EDUCATION NEEDS:   No education needs have been identified at this time  Skin:  Skin Assessment: Reviewed RN Assessment  Last BM:  no documented BM  Height:   Ht Readings from Last 1 Encounters:  07/22/19 '5\' 11"'  (1.803 m)    Weight:   Wt Readings from Last 1 Encounters:  07/24/19 (!) 157 kg    Ideal Body Weight:  78.2 kg  BMI:  Body mass index is 48.27 kg/m.  Estimated Nutritional Needs:   Kcal:  6728-9791  Protein:  156-195 grams  Fluid:  >/= 1.7 L    Gaynell Face, MS, RD, LDN Inpatient Clinical Dietitian Pager: [phone] Weekend/After Hours: [phone]

## 2019-07-24 NOTE — Progress Notes (Signed)
Patient vomited moderate amount of dark brown emesis - appears to resemble stool. Dr. Valeta Harms notified and tube feeding held and OGT placed to intermittent suction. Also notified of persistently elevated blood pressure despite frequent labetalol pushes. Orders received.   Myrle Sheng BSN RN 86M/40M MICU

## 2019-07-24 NOTE — Progress Notes (Addendum)
Pharmacy Antibiotic Note  [name] is a 43 y.o. male admitted on 07/22/2019 with sepsis likely secondary to pneumonia.  Pharmacy has been consulted for Vancomycin dosing.  AKI - Scr 2.97>4.43 S/p Vanco 2500 mg IV x 1 on 11/12 at 0800 and 1750 mg IV x1 on 11/13 at 0900. Vancomycin random level this AM is elevated at 31.  Addendum: Per CCM, transitioning to Unasyn x 5 days for aspiration PNA. MRSA negative and GPC 1/4 likely contaminant.  Plan: Unasyn 3 g IV q8h x 5 days Vancomycin and ceftriaxone discontinued per CCM Monitor renal function  Height: 5\' 11"  (180.3 cm) Weight: (!) 346 lb 2 oz (157 kg) IBW/kg (Calculated) : 75.3  Temp (24hrs), Avg:98.4 F (36.9 C), Min:98.2 F (36.8 C), Max:98.6 F (37 C)  Recent Labs  Lab 07/22/19 0413 07/22/19 0610 07/22/19 0902 07/23/19 0316 07/23/19 0847 07/24/19 0403 07/24/19 0420  WBC 24.4*  --   --  26.4*  --   --  18.6*  CREATININE 1.74*  --   --  2.97*  --   --  4.43*  LATICACIDVEN  --  4.1* 6.5*  --  1.3  --  0.9  VANCORANDOM  --   --   --   --   --  31  --     Estimated Creatinine Clearance: 32.8 mL/min (A) (by C-G formula based on SCr of 4.43 mg/dL (H)).    Allergies  Allergen Reactions  . Sulfonamide Derivatives     REACTION: Unknown reaction  . Vicodin [Hydrocodone-Acetaminophen] Itching    Antimicrobials this admission: Rocephin 11/12 >> 11/14 Vanc 11/12 >> 11/14 Unasyn 11/14 >> 11/18  Thank you for allowing pharmacy to be a part of this patient's care.  Vertis Kelch, PharmD PGY2 Cardiology Pharmacy Resident Phone [phone] 07/24/2019       10:00 AM  Please check [email or web address] for unit-specific pharmacist phone numbers

## 2019-07-25 ENCOUNTER — Inpatient Hospital Stay (HOSPITAL_COMMUNITY): Payer: Self-pay

## 2019-07-25 DIAGNOSIS — J690 Pneumonitis due to inhalation of food and vomit: Secondary | ICD-10-CM

## 2019-07-25 LAB — CBC
HCT: 41.6 % (ref 39.0–52.0)
Hemoglobin: 13.3 g/dL (ref 13.0–17.0)
MCH: 28.1 pg (ref 26.0–34.0)
MCHC: 32 g/dL (ref 30.0–36.0)
MCV: 87.8 fL (ref 80.0–100.0)
Platelets: 155 10*3/uL (ref 150–400)
RBC: 4.74 MIL/uL (ref 4.22–5.81)
RDW: 14.3 % (ref 11.5–15.5)
WBC: 14.6 10*3/uL — ABNORMAL HIGH (ref 4.0–10.5)
nRBC: 0 % (ref 0.0–0.2)

## 2019-07-25 LAB — COMPREHENSIVE METABOLIC PANEL
ALT: 43 U/L (ref 0–44)
AST: 27 U/L (ref 15–41)
Albumin: 2.8 g/dL — ABNORMAL LOW (ref 3.5–5.0)
Alkaline Phosphatase: 50 U/L (ref 38–126)
Anion gap: 15 (ref 5–15)
BUN: 109 mg/dL — ABNORMAL HIGH (ref 6–20)
CO2: 18 mmol/L — ABNORMAL LOW (ref 22–32)
Calcium: 8.6 mg/dL — ABNORMAL LOW (ref 8.9–10.3)
Chloride: 108 mmol/L (ref 98–111)
Creatinine, Ser: 4.82 mg/dL — ABNORMAL HIGH (ref 0.61–1.24)
GFR calc Af Amer: 16 mL/min — ABNORMAL LOW (ref >60)
GFR calc non Af Amer: 14 mL/min — ABNORMAL LOW (ref >60)
Glucose, Bld: 104 mg/dL — ABNORMAL HIGH (ref 70–99)
Potassium: 5 mmol/L (ref 3.5–5.1)
Sodium: 141 mmol/L (ref 135–145)
Total Bilirubin: 0.6 mg/dL (ref 0.3–1.2)
Total Protein: 6.3 g/dL — ABNORMAL LOW (ref 6.5–8.1)

## 2019-07-25 LAB — GLUCOSE, CAPILLARY
Glucose-Capillary: 105 mg/dL — ABNORMAL HIGH (ref 70–99)
Glucose-Capillary: 109 mg/dL — ABNORMAL HIGH (ref 70–99)
Glucose-Capillary: 113 mg/dL — ABNORMAL HIGH (ref 70–99)
Glucose-Capillary: 84 mg/dL (ref 70–99)
Glucose-Capillary: 84 mg/dL (ref 70–99)
Glucose-Capillary: 87 mg/dL (ref 70–99)
Glucose-Capillary: 94 mg/dL (ref 70–99)

## 2019-07-25 LAB — CULTURE, RESPIRATORY W GRAM STAIN: Culture: NORMAL

## 2019-07-25 LAB — POCT I-STAT 7, (LYTES, BLD GAS, ICA,H+H)
Acid-base deficit: 8 mmol/L — ABNORMAL HIGH (ref 0.0–2.0)
Bicarbonate: 19.1 mmol/L — ABNORMAL LOW (ref 20.0–28.0)
Calcium, Ion: 1.18 mmol/L (ref 1.15–1.40)
HCT: 39 % (ref 39.0–52.0)
Hemoglobin: 13.3 g/dL (ref 13.0–17.0)
O2 Saturation: 98 %
Patient temperature: 98.4
Potassium: 4.9 mmol/L (ref 3.5–5.1)
Sodium: 139 mmol/L (ref 135–145)
TCO2: 20 mmol/L — ABNORMAL LOW (ref 22–32)
pCO2 arterial: 43.8 mmHg (ref 32.0–48.0)
pH, Arterial: 7.246 — ABNORMAL LOW (ref 7.350–7.450)
pO2, Arterial: 119 mmHg — ABNORMAL HIGH (ref 83.0–108.0)

## 2019-07-25 LAB — CSF CULTURE W GRAM STAIN: Culture: NO GROWTH

## 2019-07-25 LAB — PHOSPHORUS
Phosphorus: 7 mg/dL — ABNORMAL HIGH (ref 2.5–4.6)
Phosphorus: 6.5 mg/dL — ABNORMAL HIGH (ref 2.5–4.6)

## 2019-07-25 LAB — PROCALCITONIN: Procalcitonin: 58.48 ng/mL

## 2019-07-25 LAB — TRIGLYCERIDES: Triglycerides: 259 mg/dL — ABNORMAL HIGH (ref <150)

## 2019-07-25 LAB — MAGNESIUM
Magnesium: 2.8 mg/dL — ABNORMAL HIGH (ref 1.7–2.4)
Magnesium: 2.9 mg/dL — ABNORMAL HIGH (ref 1.7–2.4)

## 2019-07-25 LAB — LACTIC ACID, PLASMA: Lactic Acid, Venous: 0.6 mmol/L (ref 0.5–1.9)

## 2019-07-25 MED ORDER — HYDRALAZINE HCL 50 MG PO TABS
50.0000 mg | ORAL_TABLET | Freq: Three times a day (TID) | ORAL | Status: DC
  Administered 2019-07-25 – 2019-07-28 (×8): 50 mg via ORAL
  Filled 2019-07-25 (×8): qty 1

## 2019-07-25 MED ORDER — AMLODIPINE BESYLATE 10 MG PO TABS
10.0000 mg | ORAL_TABLET | Freq: Every day | ORAL | Status: DC
  Administered 2019-07-25: 11:00:00 10 mg
  Filled 2019-07-25: qty 1

## 2019-07-25 MED ORDER — HYDRALAZINE HCL 20 MG/ML IJ SOLN
10.0000 mg | INTRAMUSCULAR | Status: DC | PRN
  Administered 2019-07-25: 06:00:00 10 mg via INTRAVENOUS
  Filled 2019-07-25 (×2): qty 1

## 2019-07-25 MED ORDER — HYDROMORPHONE HCL 2 MG PO TABS
2.0000 mg | ORAL_TABLET | Freq: Four times a day (QID) | ORAL | Status: DC
  Administered 2019-07-25 – 2019-07-26 (×3): 2 mg
  Filled 2019-07-25 (×3): qty 1

## 2019-07-25 MED ORDER — DEXMEDETOMIDINE HCL IN NACL 400 MCG/100ML IV SOLN
0.4000 ug/kg/h | INTRAVENOUS | Status: DC
  Administered 2019-07-25: 20:00:00 1.2 ug/kg/h via INTRAVENOUS
  Administered 2019-07-25 (×4): 1 ug/kg/h via INTRAVENOUS
  Administered 2019-07-26: 05:00:00 1.2 ug/kg/h via INTRAVENOUS
  Administered 2019-07-26: 1.7 ug/kg/h via INTRAVENOUS
  Administered 2019-07-26: 1 ug/kg/h via INTRAVENOUS
  Administered 2019-07-26 (×2): 1.2 ug/kg/h via INTRAVENOUS
  Administered 2019-07-27 (×2): 1.4 ug/kg/h via INTRAVENOUS
  Administered 2019-07-27: 12:00:00 1.1 ug/kg/h via INTRAVENOUS
  Administered 2019-07-27: 1.6 ug/kg/h via INTRAVENOUS
  Administered 2019-07-27: 05:00:00 1.1 ug/kg/h via INTRAVENOUS
  Administered 2019-07-27: 0.9 ug/kg/h via INTRAVENOUS
  Administered 2019-07-27: 23:00:00 1.7 ug/kg/h via INTRAVENOUS
  Administered 2019-07-27: 1.4 ug/kg/h via INTRAVENOUS
  Administered 2019-07-27: 08:00:00 1.1 ug/kg/h via INTRAVENOUS
  Administered 2019-07-28 (×9): 1.7 ug/kg/h via INTRAVENOUS
  Administered 2019-07-28: 23:00:00 1.6 ug/kg/h via INTRAVENOUS
  Administered 2019-07-28 – 2019-07-29 (×14): 1.7 ug/kg/h via INTRAVENOUS
  Administered 2019-07-29: 1.5 ug/kg/h via INTRAVENOUS
  Administered 2019-07-30: 01:00:00 1.7 ug/kg/h via INTRAVENOUS
  Administered 2019-07-30: 06:00:00 1.6 ug/kg/h via INTRAVENOUS
  Administered 2019-07-30: 04:00:00 1.7 ug/kg/h via INTRAVENOUS
  Filled 2019-07-25 (×2): qty 100
  Filled 2019-07-25 (×2): qty 200
  Filled 2019-07-25 (×2): qty 100
  Filled 2019-07-25: qty 200
  Filled 2019-07-25 (×4): qty 100
  Filled 2019-07-25: qty 200
  Filled 2019-07-25: qty 100
  Filled 2019-07-25: qty 200
  Filled 2019-07-25: qty 100
  Filled 2019-07-25: qty 300
  Filled 2019-07-25: qty 200
  Filled 2019-07-25 (×5): qty 100
  Filled 2019-07-25: qty 300
  Filled 2019-07-25 (×2): qty 100
  Filled 2019-07-25 (×2): qty 200
  Filled 2019-07-25 (×2): qty 100
  Filled 2019-07-25: qty 300
  Filled 2019-07-25 (×11): qty 100
  Filled 2019-07-25: qty 200
  Filled 2019-07-25 (×2): qty 100

## 2019-07-25 NOTE — Progress Notes (Signed)
[facility] Progress Note  Subjective: creat up slightly today, good UOP 1.5 L yesterday. On vent  Vitals:   07/25/19 0438 07/25/19 0440 07/25/19 0500 07/25/19 0600  BP:   (!) 143/89 (!) 142/84  Pulse: 70  72 66  Resp: (!) 21  20 20  Temp:      TempSrc:      SpO2: 100%  99% 100%  Weight:  (!) 156.2 kg    Height:        Inpatient medications: . amLODipine  5 mg Per Tube Daily  . chlorhexidine gluconate (MEDLINE KIT)  15 mL Mouth Rinse BID  . Chlorhexidine Gluconate Cloth  6 each Topical Daily  . feeding supplement (PRO-STAT SUGAR FREE 64)  60 mL Per Tube QID  . feeding supplement (VITAL HIGH PROTEIN)  1,000 mL Per Tube Q24H  . heparin  5,000 Units Subcutaneous Q8H  . hydrALAZINE  25 mg Oral Q8H  . HYDROmorphone  2 mg Per Tube Q4H  . insulin aspart  0-20 Units Subcutaneous Q4H  . ipratropium-albuterol  3 mL Nebulization Q6H  . mouth rinse  15 mL Mouth Rinse 10 times per day  . methylPREDNISolone (SOLU-MEDROL) injection  40 mg Intravenous Daily  . QUEtiapine  50 mg Per Tube QHS   . sodium chloride Stopped (07/22/19 1428)  . ampicillin-sulbactam (UNASYN) IV Stopped (07/25/19 0343)  . famotidine (PEPCID) IV Stopped (07/24/19 2242)  . fentaNYL infusion INTRAVENOUS 150 mcg/hr (07/25/19 0600)  . propofol (DIPRIVAN) infusion 30 mcg/kg/min (07/25/19 0600)   Place/Maintain arterial line **AND** sodium chloride, fentaNYL, fentaNYL (SUBLIMAZE) injection, fentaNYL (SUBLIMAZE) injection, hydrALAZINE, labetalol, midazolam, midazolam    Exam: Gen intubated, sedated , ETT No jvd or bruit Chest clear ant and lat bilat RRR no MRG Abd soft ntnd no mass or ascites +bs obese GU normal male w/ foley draining clear amber urine Ext no LE edema, +1 UE edema Neuro is alert, Ox 3 , nf    Home meds:  - prn ibuprofen/ prn albuterol hfa   CXR 11/12 = +diffuse IS nodular infiltrates , ? atypical infection  I/O net = +1L since admission  Renal US -  Normal kidneys  UA -  prot 30,  0-5 rbc/wbc.    UNa 36, UCreat 47   CPK 1100   Assessment/ Plan: 1. Acute renal failure - nonoliguric,  AKI most likely due to shock, +/- vanc toxicity. Urine lytes suggest ATN.  There is no indication for RRT at this time.  IV Vanc dc'd. BP's good and UOP good. Creat up slightly, rate of rise is down. No indication for RRT yet. Cont supportive care, hoping will recover shortly.  2. Volume - up 1-2 L by I/O, +some UE edema on exam.  CXR infiltrates resolving, prob infection/ aspiration.  3. Resp failure - intubated for airway protection, per CCM 4. Drug abuse - found down, +mult drugs on UDS, poss overdose 5. CAD/ sepsis - on Unasyn, urine Ag + for pneumococcus.      [name]  07/25/2019, 7:13 AM  Iron/TIBC/Ferritin/ %Sat No results found for: IRON, TIBC, FERRITIN, IRONPCTSAT Recent Labs  Lab 07/25/19 0333 07/25/19 0339  NA 141 139  K 5.0 4.9  CL 108  --   CO2 18*  --   GLUCOSE 104*  --   BUN 109*  --   CREATININE 4.82*  --   CALCIUM 8.6*  --   PHOS 7.0*  --   ALBUMIN 2.8*  --    Recent Labs    Lab 07/25/19 0333  AST 27  ALT 43  ALKPHOS 50  BILITOT 0.6  PROT 6.3*   Recent Labs  Lab 07/25/19 0333 07/25/19 0339  WBC 14.6*  --   HGB 13.3 13.3  HCT 41.6 39.0  PLT 155  --

## 2019-07-25 NOTE — Progress Notes (Signed)
[facility] Progress Note Patient Name: [name] DOB: [birth year] MRN: [id number]   Date of Service  07/25/2019  HPI/Events of Note  RN notified us of high output NG drainage that is dark brown in color. Was noted yesterday as well. NG has been to intermittent suction and has drained around 1600 cc although it is not clear when this charting started (confirmed this with RN). No hypotension, in fact hypertensive. Is on fentanyl/propofol and belly is fully soft on palpation per RN, no tenderness. No BM since admission. No peripheral edema noted  eICU Interventions  CXR and KUB ordered stat Check lactate Check CVP - unclear volume status , also with severe AKI though continues to make urine AM labs sent Asked RN to let us know when labs and imaging result      Intervention Category Major Interventions: Respiratory failure - evaluation and management Intermediate Interventions: Diagnostic test evaluation  Margaretmary Lombard 07/25/2019, 4:23 AM

## 2019-07-25 NOTE — Progress Notes (Signed)
[facility] Progress Note Patient Name: [name] DOB: [birth year] MRN: [id number]   Date of Service  07/25/2019  HPI/Events of Note  Noted BMP, lactate and X rays, along with hemoglobin No evidence of GI bleed CVP also just 11 Lactate normal and X rays with no acute issues in belly/stable chest findings Hypertensive, RN notes labetalol isn't doing much , and has oral hydralazine ordered Propofol is being lowered given elevated Tg  eICU Interventions  1. Add Prn hydralazine 2. Continue NG to LIS and NPO 3. May consider a ct abdomen pelvis if this continues 4. Nephrology follow up will be needed for his AKI  Discussed with RN      Intervention Category Major Interventions: Hypertension - evaluation and management Intermediate Interventions: Diagnostic test evaluation  Margaretmary Lombard 07/25/2019, 5:36 AM

## 2019-07-25 NOTE — Progress Notes (Addendum)
NAME:  [name], MRN:  [id number], DOB:  09/03/1976, LOS: 3 ADMISSION DATE:  07/22/2019, CONSULTATION DATE:  11/12 REFERRING MD:  Mesner, CHIEF COMPLAINT:  encephalopathy   Brief History   Found down in a motel, nonresponsive to narcan, hypercapniec in the ED, h/o asthma & tobacco abuse  History of present illness   [name] is a 43 year old gentleman with a history of tobacco abuse and asthma who was found down at a local motel.  He was unresponsive to Narcan administered by EMS.  He was emergently intubated in the ED.  First 2 blood gases have demonstrated significant hypercapnia and respiratory acidosis, which has been refractory to vent changes.  There is no family at bedside to give additional history.  Past Medical History  Asthma Previous pneumothoraces  Significant Hospital Events   11/12 Admission 11/12 intubation 11/12 TLC 11/12 A Line  Consults:    Procedures:  11/12 LP   Significant Diagnostic Tests:  11/12 head CT-no intracranial abnormalities, sinusitis (personally reviewed) 11/12 CT spine-no fractures,  (personally reviewed) 11/12 CXR-patchy diffuse infiltrates bilaterally,  (personally reviewed)  Micro Data:  Covid negative 11/12 blood cultures-drawn after antibiotics>> + for GPC>> 11/12 respiratory culture-after antibiotics 11/12 LP cultures 11/12 RVP negative 11/12 CSF Culture 11/12 Strep Pneumonia positive UAg  Antimicrobials:  11/12 ceftriaxone 11/12 azithromycin  Interim history/subjective:   Patient remains intubated on mechanical life support.  Is having good urine output.  Patient has made 1.75 liters of urine since yesterday.  Objective   Blood pressure (!) 160/80, pulse 71, temperature 98.7 F (37.1 C), temperature source Axillary, resp. rate 20, height 5\' 11"  (1.803 m), weight (!) 156.2 kg, SpO2 100 %. CVP:  [2 mmHg-12 mmHg] 2 mmHg  Vent Mode: PRVC FiO2 (%):  [30 %-40 %] 30 % Set Rate:  [21 bmp] 21 bmp Vt Set:  [650 mL] 650  mL PEEP:  [5 cmH20-7 cmH20] 5 cmH20 Pressure Support:  [5 cmH20] 5 cmH20 Plateau Pressure:  [14 cmH20-27 cmH20] 21 cmH20   Intake/Output Summary (Last 24 hours) at 07/25/2019 0958 Last data filed at 07/25/2019 0900 Gross per 24 hour  Intake 2163.95 ml  Output 3725 ml  Net -1561.05 ml   Filed Weights   07/23/19 0321 07/24/19 0415 07/25/19 0440  Weight: (!) 156.4 kg (!) 157 kg (!) 156.2 kg    Examination: General: Obese male, intubated on mechanical life support, responsive to pain agitated at times HENT: NCAT, sclera clear pupils reactive will not track Lungs: Bilateral ventilated breath sounds, no crackles no wheeze Cardiovascular: S1-S2, regular rate and rhythm Abdomen: Obese pannus, soft nontender to palpation no grimace Extremities no significant edema Neuro: Sedated, sedation lightened he will move all 4 extremities spontaneously will not follow commands Derm: No rash, distal lower extremities are cool to touch  Resolved Hospital Problem list     Assessment & Plan:   Acute hypoxemic hypercarbic respiratory failure requiring intubation mechanical ventilation, patient with a history of asthma, concern for aspiration pneumonia Patient was found down at motel secondary to possible drug overdose, and urine drug screen positive for methamphetamines, opiates, marijuana.  Urine antigen positive for pneumococcus. Acute metabolic toxic encephalopathy secondary to above -Patient remains intubated predominantly secondary due to his encephalopathy from listed above conditions. -I suspect he has an aspiration pneumonia on top of his overdose after being found down in a motel. -Overall his mechanical support is slowly decreasing and his mental status is improving. -We have been able to decrease his sedation  requirements with the addition of oral medications yesterday. -I believe it still will take some time as he starts to improve -His uremia is also worse from his renal failure which is  likely playing a component to his encephalopathy. -Continue Seroquel nightly, oral opiates scheduled down tube, Dilaudid 2 every 6 -Wean from IV fentanyl requirements -Wean from propofol requirements -May consider switching to Precedex. -Continue tapering steroids -Gram-positive cocci in cultures is coag negative staph, likely contaminant, MRSA PCR negative -Continue Unasyn, procalcitonin is slowly improving  Acute toxic metabolic encephalopathy Likely overdose, urine drug screen positive for amphetamines, opiates and marijuana. Renal failure and uremia -Conservative management  Sepsis due to community-acquired pneumonia, urine antigen positive for pneumococcus Chest x-ray with persistent bilateral infiltrates -The escalated to Unasyn  Lactic acidosis - likely due to sepsis and hypoxia -Improved  Acute renal failure, Uremia  -Renal ultrasound no evidence obstruction, appears to be ATN likely related to shock state and hypotension -Urine output has picked up, approximately 1.75 L of urine overnight -No indication for dialysis at this time -Appreciate nephrology input  HTN Plan -Added oral blood pressure medications with as needed -Increased amlodipine, as needed labetalol  History of tobacco abuse Polysubstance abuse -This will make sedation more complicated as well as liberation from the ventilator   Best practice:  Diet: Tube feeds, ICU Pep therapy Pain/Anxiety/Delirium protocol (if indicated): Fentanyl, oral Dilaudid, propofol, as needed Versed VAP protocol (if indicated): Ordered DVT prophylaxis: Heparin GI prophylaxis: Pepcid Glucose control: Sliding scale insulin Mobility: Bedrest Code Status: full Family Communication: We will attempt: Update family. Disposition: ICU  Labs   CBC: Recent Labs  Lab 07/22/19 0413  07/23/19 0316 07/23/19 0822 07/24/19 0417 07/24/19 0420 07/25/19 0333 07/25/19 0339  WBC 24.4*  --  26.4*  --   --  18.6* 14.6*  --    NEUTROABS 20.9*  --   --   --   --   --   --   --   HGB 17.8*   < > 15.7 15.6 13.3 13.4 13.3 13.3  HCT 58.7*   < > 50.9 46.0 39.0 42.0 41.6 39.0  MCV 93.3  --  91.5  --   --  88.6 87.8  --   PLT 316  --  202  --   --  170 155  --    < > = values in this interval not displayed.    Basic Metabolic Panel: Recent Labs  Lab 07/22/19 0413 07/22/19 0902  07/23/19 0316 07/23/19 0822 07/24/19 0417 07/24/19 0420 07/24/19 1753 07/25/19 0333 07/25/19 0339  NA 139  --    < > 139 139 135 137  --  141 139  K 4.7  --    < > 4.5 4.4 4.2 4.3  --  5.0 4.9  CL 103  --   --  106  --   --  104  --  108  --   CO2 24  --   --  19*  --   --  19*  --  18*  --   GLUCOSE 271*  --   --  165*  --   --  130*  --  104*  --   BUN 21*  --   --  38*  --   --  76*  --  109*  --   CREATININE 1.74*  --   --  2.97*  --   --  4.43*  --  4.82*  --  CALCIUM 8.9  --   --  7.8*  --   --  8.2*  --  8.6*  --   MG  --  2.3  --   --   --   --  2.4 2.8* 2.8*  --   PHOS  --   --   --   --   --   --   --  6.8* 7.0*  --    < > = values in this interval not displayed.   GFR: Estimated Creatinine Clearance: 30.1 mL/min (A) (by C-G formula based on SCr of 4.82 mg/dL (H)). Recent Labs  Lab 07/22/19 0413  07/22/19 0902 07/23/19 0316 07/23/19 0847 07/24/19 0420 07/25/19 0333 07/25/19 0427  PROCALCITON  --   --   --   --   --  86.68 58.48  --   WBC 24.4*  --   --  26.4*  --  18.6* 14.6*  --   LATICACIDVEN  --    < > 6.5*  --  1.3 0.9  --  0.6   < > = values in this interval not displayed.    Liver Function Tests: Recent Labs  Lab 07/22/19 0413 07/23/19 0316 07/24/19 0420 07/25/19 0333  AST 63* 45* 43* 27  ALT 58* 48* 45* 43  ALKPHOS 99 61 54 50  BILITOT 0.4 0.7 0.6 0.6  PROT 8.0 6.4* 6.1* 6.3*  ALBUMIN 3.9 2.8* 2.7* 2.8*   No results for input(s): LIPASE, AMYLASE in the last 168 hours. No results for input(s): AMMONIA in the last 168 hours.  ABG    Component Value Date/Time   PHART 7.246 (L) 07/25/2019  0339   PCO2ART 43.8 07/25/2019 0339   PO2ART 119.0 (H) 07/25/2019 0339   HCO3 19.1 (L) 07/25/2019 0339   TCO2 20 (L) 07/25/2019 0339   ACIDBASEDEF 8.0 (H) 07/25/2019 0339   O2SAT 98.0 07/25/2019 0339     Coagulation Profile: No results for input(s): INR, PROTIME in the last 168 hours.  Cardiac Enzymes: Recent Labs  Lab 07/24/19 1122  CKTOTAL 1,108*    HbA1C: Hgb A1c MFr Bld  Date/Time Value Ref Range Status  07/22/2019 09:02 AM 5.9 (H) 4.8 - 5.6 % Final    Comment:    (NOTE) Pre diabetes:          5.7%-6.4% Diabetes:              >6.4% Glycemic control for   <7.0% adults with diabetes     CBG: Recent Labs  Lab 07/24/19 1633 07/24/19 2055 07/25/19 0040 07/25/19 0307 07/25/19 0809  GLUCAP 143* 125* 84 84 94    This patient is critically ill with multiple organ system failure; which, requires frequent high complexity decision making, assessment, support, evaluation, and titration of therapies. This was completed through the application of advanced monitoring technologies and extensive interpretation of multiple databases. During this encounter critical care time was devoted to patient care services described in this note for 34 minutes.  Garner Nash, DO [facility] 07/25/2019 9:58 AM  Personal pager: [phone] If unanswered, please page CCM On-call: [phone]

## 2019-07-26 DIAGNOSIS — G9341 Metabolic encephalopathy: Secondary | ICD-10-CM

## 2019-07-26 LAB — BASIC METABOLIC PANEL
Anion gap: 14 (ref 5–15)
BUN: 119 mg/dL — ABNORMAL HIGH (ref 6–20)
CO2: 17 mmol/L — ABNORMAL LOW (ref 22–32)
Calcium: 8.5 mg/dL — ABNORMAL LOW (ref 8.9–10.3)
Chloride: 114 mmol/L — ABNORMAL HIGH (ref 98–111)
Creatinine, Ser: 4.44 mg/dL — ABNORMAL HIGH (ref 0.61–1.24)
GFR calc Af Amer: 18 mL/min — ABNORMAL LOW (ref >60)
GFR calc non Af Amer: 15 mL/min — ABNORMAL LOW (ref >60)
Glucose, Bld: 115 mg/dL — ABNORMAL HIGH (ref 70–99)
Potassium: 5.4 mmol/L — ABNORMAL HIGH (ref 3.5–5.1)
Sodium: 145 mmol/L (ref 135–145)

## 2019-07-26 LAB — GLUCOSE, CAPILLARY
Glucose-Capillary: 107 mg/dL — ABNORMAL HIGH (ref 70–99)
Glucose-Capillary: 63 mg/dL — ABNORMAL LOW (ref 70–99)
Glucose-Capillary: 70 mg/dL (ref 70–99)
Glucose-Capillary: 79 mg/dL (ref 70–99)
Glucose-Capillary: 92 mg/dL (ref 70–99)
Glucose-Capillary: 95 mg/dL (ref 70–99)

## 2019-07-26 LAB — CBC
HCT: 40.7 % (ref 39.0–52.0)
Hemoglobin: 13.2 g/dL (ref 13.0–17.0)
MCH: 28 pg (ref 26.0–34.0)
MCHC: 32.4 g/dL (ref 30.0–36.0)
MCV: 86.4 fL (ref 80.0–100.0)
Platelets: 139 10*3/uL — ABNORMAL LOW (ref 150–400)
RBC: 4.71 MIL/uL (ref 4.22–5.81)
RDW: 14.8 % (ref 11.5–15.5)
WBC: 12.4 10*3/uL — ABNORMAL HIGH (ref 4.0–10.5)
nRBC: 0 % (ref 0.0–0.2)

## 2019-07-26 LAB — MAGNESIUM: Magnesium: 3 mg/dL — ABNORMAL HIGH (ref 1.7–2.4)

## 2019-07-26 MED ORDER — HYDRALAZINE HCL 20 MG/ML IJ SOLN
10.0000 mg | INTRAMUSCULAR | Status: DC | PRN
  Administered 2019-07-27: 10 mg via INTRAVENOUS

## 2019-07-26 MED ORDER — LORAZEPAM 2 MG/ML IJ SOLN
2.0000 mg | INTRAMUSCULAR | Status: DC | PRN
  Administered 2019-07-26 – 2019-07-30 (×8): 2 mg via INTRAVENOUS
  Filled 2019-07-26 (×10): qty 1

## 2019-07-26 MED ORDER — FENTANYL CITRATE (PF) 100 MCG/2ML IJ SOLN
50.0000 ug | INTRAMUSCULAR | Status: DC | PRN
  Administered 2019-07-26 – 2019-07-27 (×2): 100 ug via INTRAVENOUS
  Filled 2019-07-26 (×3): qty 2

## 2019-07-26 MED ORDER — ORAL CARE MOUTH RINSE
15.0000 mL | Freq: Two times a day (BID) | OROMUCOSAL | Status: DC
  Administered 2019-07-26 – 2019-07-28 (×4): 15 mL via OROMUCOSAL

## 2019-07-26 MED ORDER — AMLODIPINE BESYLATE 10 MG PO TABS
10.0000 mg | ORAL_TABLET | Freq: Every day | ORAL | Status: DC
  Administered 2019-07-27 – 2019-07-28 (×2): 10 mg via ORAL
  Filled 2019-07-26 (×2): qty 1

## 2019-07-26 MED ORDER — DEXTROSE 50 % IV SOLN
INTRAVENOUS | Status: AC
  Administered 2019-07-26: 25 mL
  Filled 2019-07-26: qty 50

## 2019-07-26 MED ORDER — NICOTINE 14 MG/24HR TD PT24
14.0000 mg | MEDICATED_PATCH | Freq: Every day | TRANSDERMAL | Status: DC
  Administered 2019-07-26: 14 mg via TRANSDERMAL

## 2019-07-26 MED ORDER — SODIUM CHLORIDE 0.9 % IV SOLN
8.0000 mg | Freq: Four times a day (QID) | INTRAVENOUS | Status: DC | PRN
  Filled 2019-07-26 (×2): qty 4

## 2019-07-26 MED ORDER — LORAZEPAM 2 MG/ML IJ SOLN
INTRAMUSCULAR | Status: AC
  Administered 2019-07-26: 14:00:00 2 mg
  Filled 2019-07-26: qty 1

## 2019-07-26 MED ORDER — SODIUM ZIRCONIUM CYCLOSILICATE 10 G PO PACK: 10.0000 g | PACK | Freq: Every day | ORAL | Status: DC

## 2019-07-26 NOTE — Progress Notes (Signed)
NAME:  [name], MRN:  [id number], DOB:  1976/02/08, LOS: 4 ADMISSION DATE:  07/22/2019, CONSULTATION DATE:  11/12 REFERRING MD:  Mesner, CHIEF COMPLAINT:  encephalopathy   Brief History   Found down in a motel, nonresponsive to narcan, hypercapniec in the ED, h/o asthma & tobacco abuse  History of present illness   [name] is a 43 year old gentleman with a history of tobacco abuse and asthma who was found down at a local motel.  He was unresponsive to Narcan administered by EMS.  He was emergently intubated in the ED.  First 2 blood gases have demonstrated significant hypercapnia and respiratory acidosis, which has been refractory to vent changes.  There is no family at bedside to give additional history.  Past Medical History  Asthma Previous pneumothoraces  Significant Hospital Events   11/12 Admission 11/12 intubation 11/12 TLC 11/12 A Line  Consults:  none  Pro cedures:   LP   Significant Diagnostic Tests:  11/12 head CT-no intracranial abnormalities, sinusitis (personally reviewed) 11/12 CT spine-no fractures,  (personally reviewed) 11/12 CXR-patchy diffuse infiltrates bilaterally,  (personally reviewed)  Micro Data:  Covid negative 11/12 blood cultures-drawn after antibiotics>> + for GPC>> 11/12 respiratory culture-after antibiotics 11/12 LP cultures 11/12 RVP negative 11/12 CSF Culture 11/12 Strep Pneumonia positive UAg  Antimicrobials:  11/12 ceftriaxone 11/12 azithromycin  Interim history/subjective:   Continues to diuresis spontaneously. OGT on suction overnight. Patient denies abdominal pain or distention. Comfortable on SBT.  Objective   Blood pressure 131/78, pulse 71, temperature 98.6 F (37 C), temperature source Axillary, resp. rate 18, height 5\' 11"  (1.803 m), weight (!) 154.4 kg, SpO2 98 %. CVP:  [12 mmHg-14 mmHg] 14 mmHg  Vent Mode: PRVC FiO2 (%):  [30 %] 30 % Set Rate:  [21 bmp] 21 bmp Vt Set:  [650 mL] 650 mL PEEP:  [5 cmH20] 5  cmH20 Pressure Support:  [5 cmH20] 5 cmH20 Plateau Pressure:  [15 cmH20-21 cmH20] 15 cmH20   Intake/Output Summary (Last 24 hours) at 07/26/2019 0747 Last data filed at 07/26/2019 0600 Gross per 24 hour  Intake 2060.67 ml  Output 4350 ml  Net -2289.33 ml   Filed Weights   07/24/19 0415 07/25/19 0440 07/26/19 0350  Weight: (!) 157 kg (!) 156.2 kg (!) 154.4 kg    Examination: General: Obese male, intubated on mechanical life support, alert and calm. HENT:  ETT and OGT in place without skin breakdown. Lungs: Bilateral ventilated breath sounds, no crackles no wheeze.  Cardiovascular: S1-S2, regular rate and rhythm Abdomen: Obese pannus, soft nontender to palpation, active bowel sounds Extremities no significant edema Neuro: Sedated, sedation lightened he will move all 4 extremities to commands. Negative delirium screen, but perseverates with writing. Derm: No rash, distal lower extremities are cool to touch  Resolved Hospital Problem list   Lactic acidosis.  Assessment & Plan:   Critical ill due to acute hypoxemic hypercarbic respiratory failure requiring intubation mechanical ventilation, patient with a history of asthma, concern for aspiration pneumonia Patient remains intubated predominantly secondary due to his encephalopathy from listed above conditions.I suspect he has an aspiration pneumonia on top of his overdose after being found down in a motel.  History of asthma No active wheezing. - Rapid steroid taper.   Critically ill due to acute toxic metabolic encephalopathy requiring titration of sedative infusions.   Driven by sepsis on background of likely overdose, urine drug screen positive for amphetamines, opiates and marijuana. Now essentially resolved. - Monitor for agitation once extubated  and sedative infusions have been stopped.  Sepsis due to community-acquired pneumonia, urine antigen positive for pneumococcus Chest x-ray with persistent bilateral infiltrates  -De-escalated to Unasyn  Acute renal failure, Uremia  Renal ultrasound no evidence obstruction, appears to be ATN likely related to shock state and hypotension Urine output has picked up, approximately 1.75 L of urine overnight -No indication for dialysis at this time -Appreciate nephrology input  Chronic essential HTN  -Added oral blood pressure medications with as needed -Increased amlodipine, as needed labetalol  History of tobacco abuse Polysubstance abuse -This will make sedation more complicated as well as liberation from the ventilator   Daily Goals Checklist  Pain/Anxiety/Delirium protocol (if indicated): wean while on SBT. Stop fentanyl prior to extubation and continue Precedex. Stop oral hydromorphone and quetiapine post extubation. VAP protocol (if indicated): bundle in place. Respiratory support goals: extubate if tolerates 2h SBT. Blood pressure target: Tolerate up to SBP 180 while weaning. Adjust oral post extubation DVT prophylaxis: SCD, UFH tid Nutritional status and feeding goals: NPO, Bedside swallow once extubated GI prophylaxis: famotidine Fluid status goals: Spontaneous diuresis. Urinary catheter: Keep Foley until after extubatio and try to transition to urinal Central lines: Arterial and central line removal Glucose control: Monitor only Mobility/therapy needs: OOB chair post extubation. Antibiotic de-escalation: Complete 5 days of antibiotics. Can stop today. Home medication reconciliation: Added nicotine replacement. Restart home asthma MDI post extubation. Daily labs: BMP daily to follow creatinine Code Status: Full Family Communication: Sister is point of contact. Disposition: ICU  Labs   CBC: Recent Labs  Lab 07/22/19 0413  07/23/19 0316  07/24/19 0417 07/24/19 0420 07/25/19 0333 07/25/19 0339 07/26/19 0417  WBC 24.4*  --  26.4*  --   --  18.6* 14.6*  --  12.4*  NEUTROABS 20.9*  --   --   --   --   --   --   --   --   HGB 17.8*   < > 15.7    < > 13.3 13.4 13.3 13.3 13.2  HCT 58.7*   < > 50.9   < > 39.0 42.0 41.6 39.0 40.7  MCV 93.3  --  91.5  --   --  88.6 87.8  --  86.4  PLT 316  --  202  --   --  170 155  --  139*   < > = values in this interval not displayed.    Basic Metabolic Panel: Recent Labs  Lab 07/22/19 0413  07/23/19 0316  07/24/19 0417 07/24/19 0420 07/24/19 1753 07/25/19 0333 07/25/19 0339 07/25/19 1200 07/26/19 0417  NA 139   < > 139   < > 135 137  --  141 139  --  145  K 4.7   < > 4.5   < > 4.2 4.3  --  5.0 4.9  --  5.4*  CL 103  --  106  --   --  104  --  108  --   --  114*  CO2 24  --  19*  --   --  19*  --  18*  --   --  17*  GLUCOSE 271*  --  165*  --   --  130*  --  104*  --   --  115*  BUN 21*  --  38*  --   --  76*  --  109*  --   --  119*  CREATININE 1.74*  --  2.97*  --   --  4.43*  --  4.82*  --   --  4.44*  CALCIUM 8.9  --  7.8*  --   --  8.2*  --  8.6*  --   --  8.5*  MG  --    < >  --   --   --  2.4 2.8* 2.8*  --  2.9* 3.0*  PHOS  --   --   --   --   --   --  6.8* 7.0*  --  6.5*  --    < > = values in this interval not displayed.   GFR: Estimated Creatinine Clearance: 32.4 mL/min (A) (by C-G formula based on SCr of 4.44 mg/dL (H)). Recent Labs  Lab 07/22/19 0902 07/23/19 0316 07/23/19 0847 07/24/19 0420 07/25/19 0333 07/25/19 0427 07/26/19 0417  PROCALCITON  --   --   --  86.68 58.48  --   --   WBC  --  26.4*  --  18.6* 14.6*  --  12.4*  LATICACIDVEN 6.5*  --  1.3 0.9  --  0.6  --     Liver Function Tests: Recent Labs  Lab 07/22/19 0413 07/23/19 0316 07/24/19 0420 07/25/19 0333  AST 63* 45* 43* 27  ALT 58* 48* 45* 43  ALKPHOS 99 61 54 50  BILITOT 0.4 0.7 0.6 0.6  PROT 8.0 6.4* 6.1* 6.3*  ALBUMIN 3.9 2.8* 2.7* 2.8*   No results for input(s): LIPASE, AMYLASE in the last 168 hours. No results for input(s): AMMONIA in the last 168 hours.  ABG    Component Value Date/Time   PHART 7.246 (L) 07/25/2019 0339   PCO2ART 43.8 07/25/2019 0339   PO2ART 119.0 (H)  07/25/2019 0339   HCO3 19.1 (L) 07/25/2019 0339   TCO2 20 (L) 07/25/2019 0339   ACIDBASEDEF 8.0 (H) 07/25/2019 0339   O2SAT 98.0 07/25/2019 0339     Coagulation Profile: No results for input(s): INR, PROTIME in the last 168 hours.  Cardiac Enzymes: Recent Labs  Lab 07/24/19 1122  CKTOTAL 1,108*    HbA1C: Hgb A1c MFr Bld  Date/Time Value Ref Range Status  07/22/2019 09:02 AM 5.9 (H) 4.8 - 5.6 % Final    Comment:    (NOTE) Pre diabetes:          5.7%-6.4% Diabetes:              >6.4% Glycemic control for   <7.0% adults with diabetes     CBG: Recent Labs  Lab 07/25/19 1216 07/25/19 1601 07/25/19 1918 07/25/19 2319 07/26/19 0340  GLUCAP 87 109* 113* 105* 107*    CRITICAL CARE Performed by: Kipp Brood   Total critical care time: 45 minutes  Critical care time was exclusive of separately billable procedures and treating other patients.  Critical care was necessary to treat or prevent imminent or life-threatening deterioration.  Critical care was time spent personally by me on the following activities: development of treatment plan with patient and/or surrogate as well as nursing, discussions with consultants, evaluation of patient's response to treatment, examination of patient, obtaining history from patient or surrogate, ordering and performing treatments and interventions, ordering and review of laboratory studies, ordering and review of radiographic studies, pulse oximetry, re-evaluation of patient's condition and participation in multidisciplinary rounds.  Kipp Brood, MD [facility] ICU Physician [place]  Pager: [phone] Mobile: [phone] After hours: [phone].

## 2019-07-26 NOTE — Progress Notes (Signed)
Patient self extubated. Placed on [place]. Vitals stable at this time. Patient able to speak. No stridor noted. RN and MD aware. RT will continue to monitor.

## 2019-07-26 NOTE — Progress Notes (Signed)
[facility] Progress Note  Subjective: self- extubated, asking for water.  Cr coming down slightly, robust urine output.  Vitals:   07/26/19 0500 07/26/19 0600 07/26/19 0800 07/26/19 0804  BP: 138/87 131/78  (!) 161/81  Pulse: 71 71  78  Resp: _0 Temp:   98.1 F (36.7 C)   TempSrc:   Oral   SpO2: 98% 98%    Weight:      Height:        Inpatient medications: . amLODipine  10 mg Per Tube Daily  . chlorhexidine gluconate (MEDLINE KIT)  15 mL Mouth Rinse BID  . Chlorhexidine Gluconate Cloth  6 each Topical Daily  . feeding supplement (PRO-STAT SUGAR FREE 64)  60 mL Per Tube QID  . feeding supplement (VITAL HIGH PROTEIN)  1,000 mL Per Tube Q24H  . heparin  5,000 Units Subcutaneous Q8H  . hydrALAZINE  50 mg Oral Q8H  . HYDROmorphone  2 mg Per Tube Q6H  . insulin aspart  0-20 Units Subcutaneous Q4H  . ipratropium-albuterol  3 mL Nebulization Q6H  . mouth rinse  15 mL Mouth Rinse 10 times per day  . methylPREDNISolone (SOLU-MEDROL) injection  40 mg Intravenous Daily  . nicotine  14 mg Transdermal Daily  . QUEtiapine  50 mg Per Tube QHS  . sodium zirconium cyclosilicate  10 g Oral Daily   . sodium chloride Stopped (07/22/19 1428)  . ampicillin-sulbactam (UNASYN) IV Stopped (07/26/19 0423)  . dexmedetomidine (PRECEDEX) IV infusion 1.2 mcg/kg/hr (07/26/19 0600)  . famotidine (PEPCID) IV 20 mg (07/26/19 1000)  . fentaNYL infusion INTRAVENOUS 300 mcg/hr (07/26/19 0600)  . ondansetron (ZOFRAN) IV     Place/Maintain arterial line **AND** sodium chloride, fentaNYL, fentaNYL (SUBLIMAZE) injection, fentaNYL (SUBLIMAZE) injection, hydrALAZINE, midazolam, midazolam, ondansetron (ZOFRAN) IV    Exam: Gen wiggling in bed, alert No jvd or bruit Chest clear bilaterally RRR no MRG Abd soft ntnd no mass or ascites +bs obese GU + Foley with good urine Ext no LE edema, +1 UE edema Neuro is alert, Ox 3 , nf    Home meds:  - prn ibuprofen/ prn albuterol hfa   CXR 11/12 = +diffuse IS nodular infiltrates , ? atypical infection  I/O net = +1L since admission  Renal US -  Normal kidneys  UA - prot 30,  0-5 rbc/wbc.    UNa 36, UCreat 47   CPK 1100   Assessment/ Plan: 1. Acute renal failure - nonoliguric,  AKI most likely due to shock, +/- vanc toxicity. Urine lytes suggest ATN.  There is no indication for RRT at this time.  IV Vanc dc'd. BP's good and UOP good. Creatinine plateauing, hopefully will see a distinct downward trend tomorrow.   2. Volume - up 1-2 L by I/O, +some UE edema on exam.  CXR infiltrates resolving, prob infection/ aspiration.  3. Resp failure - self-extubated 4. Drug abuse - found down, +mult drugs on UDS, poss overdose 5. CAD/ sepsis - on Unasyn, urine Ag + for pneumococcus.  6. Hyperkalemia: mild, on Roberts Gaudy MD 07/26/2019, 11:18 AM  Iron/TIBC/Ferritin/ %Sat No results found for: IRON, TIBC, FERRITIN, IRONPCTSAT Recent Labs  Lab 07/25/19 0333  07/25/19 1200 07/26/19 0417  NA 141   < >  --  145  K 5.0   < >  --  5.4*  CL 108  --   --  114*  CO2 18*  --   --  17*  GLUCOSE 104*  --   --  115*  BUN 109*  --   --  119*  CREATININE 4.82*  --   --  4.44*  CALCIUM 8.6*  --   --  8.5*  PHOS 7.0*  --  6.5*  --   ALBUMIN 2.8*  --   --   --    < > = values in this interval not displayed.   Recent Labs  Lab 07/25/19 0333  AST 27  ALT 43  ALKPHOS 50  BILITOT 0.6  PROT 6.3*   Recent Labs  Lab 07/26/19 0417  WBC 12.4*  HGB 13.2  HCT 40.7  PLT 139*

## 2019-07-27 ENCOUNTER — Inpatient Hospital Stay (HOSPITAL_COMMUNITY): Payer: Self-pay

## 2019-07-27 LAB — SODIUM
Sodium: 155 mmol/L — ABNORMAL HIGH (ref 135–145)
Sodium: 157 mmol/L — ABNORMAL HIGH (ref 135–145)

## 2019-07-27 LAB — BASIC METABOLIC PANEL
Anion gap: 14 (ref 5–15)
BUN: 110 mg/dL — ABNORMAL HIGH (ref 6–20)
CO2: 20 mmol/L — ABNORMAL LOW (ref 22–32)
Calcium: 9 mg/dL (ref 8.9–10.3)
Chloride: 120 mmol/L — ABNORMAL HIGH (ref 98–111)
Creatinine, Ser: 3.32 mg/dL — ABNORMAL HIGH (ref 0.61–1.24)
GFR calc Af Amer: 25 mL/min — ABNORMAL LOW (ref >60)
GFR calc non Af Amer: 22 mL/min — ABNORMAL LOW (ref >60)
Glucose, Bld: 63 mg/dL — ABNORMAL LOW (ref 70–99)
Potassium: 5.2 mmol/L — ABNORMAL HIGH (ref 3.5–5.1)
Sodium: 154 mmol/L — ABNORMAL HIGH (ref 135–145)

## 2019-07-27 LAB — GLUCOSE, CAPILLARY
Glucose-Capillary: 146 mg/dL — ABNORMAL HIGH (ref 70–99)
Glucose-Capillary: 157 mg/dL — ABNORMAL HIGH (ref 70–99)
Glucose-Capillary: 194 mg/dL — ABNORMAL HIGH (ref 70–99)
Glucose-Capillary: 34 mg/dL — CL (ref 70–99)
Glucose-Capillary: 45 mg/dL — ABNORMAL LOW (ref 70–99)
Glucose-Capillary: 56 mg/dL — ABNORMAL LOW (ref 70–99)
Glucose-Capillary: 80 mg/dL (ref 70–99)
Glucose-Capillary: 86 mg/dL (ref 70–99)
Glucose-Capillary: 91 mg/dL (ref 70–99)
Glucose-Capillary: 99 mg/dL (ref 70–99)

## 2019-07-27 LAB — CULTURE, BLOOD (ROUTINE X 2): Culture: NO GROWTH

## 2019-07-27 LAB — LEGIONELLA PNEUMOPHILA SEROGP 1 UR AG: L. pneumophila Serogp 1 Ur Ag: NEGATIVE

## 2019-07-27 MED ORDER — DEXTROSE 50 % IV SOLN
INTRAVENOUS | Status: AC
  Administered 2019-07-27: 10:00:00 50 mL
  Filled 2019-07-27: qty 50

## 2019-07-27 MED ORDER — SODIUM ZIRCONIUM CYCLOSILICATE 10 G PO PACK
10.0000 g | PACK | Freq: Two times a day (BID) | ORAL | Status: DC
  Administered 2019-07-27 – 2019-07-28 (×3): 10 g via ORAL
  Filled 2019-07-27 (×3): qty 1

## 2019-07-27 MED ORDER — INSULIN ASPART 100 UNIT/ML ~~LOC~~ SOLN: 0.0000 [IU] | SUBCUTANEOUS | Status: DC

## 2019-07-27 MED ORDER — DEXTROSE 5 % IV SOLN
INTRAVENOUS | Status: DC
  Administered 2019-07-27 – 2019-07-30 (×7): via INTRAVENOUS

## 2019-07-27 MED ORDER — QUETIAPINE FUMARATE 50 MG PO TABS
50.0000 mg | ORAL_TABLET | Freq: Two times a day (BID) | ORAL | Status: DC
  Administered 2019-07-27 – 2019-07-28 (×4): 50 mg via ORAL
  Filled 2019-07-27 (×4): qty 1

## 2019-07-27 MED ORDER — IPRATROPIUM-ALBUTEROL 0.5-2.5 (3) MG/3ML IN SOLN
3.0000 mL | Freq: Four times a day (QID) | RESPIRATORY_TRACT | Status: DC | PRN
  Administered 2019-07-28 – 2019-07-29 (×2): 3 mL via RESPIRATORY_TRACT
  Filled 2019-07-27 (×2): qty 3

## 2019-07-27 MED ORDER — FENTANYL 75 MCG/HR TD PT72
1.0000 | MEDICATED_PATCH | TRANSDERMAL | Status: DC
  Administered 2019-07-27: 14:00:00 1 via TRANSDERMAL
  Filled 2019-07-27: qty 1

## 2019-07-27 MED ORDER — HALOPERIDOL LACTATE 5 MG/ML IJ SOLN
5.0000 mg | Freq: Once | INTRAMUSCULAR | Status: AC
  Administered 2019-07-27: 5 mg via INTRAMUSCULAR
  Filled 2019-07-27: qty 1

## 2019-07-27 NOTE — Progress Notes (Signed)
[facility] Progress Note Patient Name: [name] DOB: Aug 04, 1976 MRN: [id number]   Date of Service  07/27/2019  HPI/Events of Note  Pt admitted to the hospital for acute respiratory failure related to polysubstance abuse. He has been extremely agitated and pulled his iv out this evening then refused any attempt to re-insert iv. Baseline EKG with QT of 0.406  eICU Interventions  Haldol 5 mg iv x 1 to permit sufficient calmness for re-insertion of iv.        Kerry Kass Ogan 07/27/2019, 12:59 AM

## 2019-07-27 NOTE — Progress Notes (Signed)
Inpatient Diabetes Program Recommendations  AACE/ADA: New Consensus Statement on Inpatient Glycemic Control (2015)  Target Ranges:  Prepandial:   less than 140 mg/dL      Peak postprandial:   less than 180 mg/dL (1-2 hours)      Critically ill patients:  140 - 180 mg/dL   Results for [name], [name] (MRN [id number]) as of 07/27/2019 07:44  Ref. Range 07/26/2019 23:33 07/27/2019 01:58 07/27/2019 03:18 07/27/2019 03:20 07/27/2019 03:26 07/27/2019 03:46  Glucose-Capillary Latest Ref Range: 70 - 99 mg/dL 63 (L) 194 (H) 38 (LL) 34 (LL) 56 (L) 86     Admit with: Found down in a motel, nonresponsive to narcan, hypercapniec in the ED  NO History of Diabetes   Current Orders: Novolog Resistant Correction Scale/ SSI (0-20 units) Q4 hours      Getting Solumedrol 40 mg Daily.  Self Extubated yesterday AM.    MD- Note patient Hypoglycemic at 12am and 3am.  No Insulin given since 9pm on 11/14.  Please consider discontinuing Novolog SSI for now.  Would continue with CBG checks     --Will follow patient during hospitalization--  Wyn Quaker RN, MSN, CDE Diabetes Coordinator Inpatient Glycemic Control Team Team Pager: [phone] (8a-5p)

## 2019-07-27 NOTE — Progress Notes (Signed)
SLP Cancellation Note  Patient Details Name: [name] MRN: [id number] DOB: 07/25/1976   Cancelled treatment:        Attempted to see pt for clinical swallow evaluation.  Pt not medically appropriate at this time. In need of IV access as priority.  SLP will reattempt as schedule permits.   Celedonio Savage, MA, [place] Office: [phone]; Pager [phone]): [phone] (via text page only) 07/27/2019, 9:15 AM

## 2019-07-27 NOTE — Progress Notes (Signed)
[facility] Progress Note    Assessment/ Plan:   1. Acute renal failure - nonoliguric, AKI most likely due to shock, +/- vanc toxicity. Urine lytes suggest ATN. There is no indication for RRT at this time. IV Vanc dc'd. BP's good and UOP good. Creatinine downtrending with autodiuresis component- Na up today, will add hypotonic IVFs.  2. Hypernatremia: Na 154 this AM, D5W added, check serial Na to make sure coming down.  3. Hypoglycemia: likely poor oral intake, D5W as above  4. Resp failure - self-extubated 11/16 5. Drug abuse - found down, +mult drugs on UDS, poss overdose 6. CAD/ sepsis - on Unasyn, urine Ag + for pneumococcus. 7. Hyperkalemia: mild, on Lokelma, increase to BID  Subjective:    Urine output increasing, Na up to 154 this AM.  Agitated overnight, got haldol, more calm this AM.   Objective:   BP 122/68   Pulse 64   Temp 98.3 F (36.8 C) (Axillary)   Resp (!) 23   Ht 5\' 11"  (1.803 m)   Wt (!) 154.4 kg   SpO2 96%   BMI 47.47 kg/m   Intake/Output Summary (Last 24 hours) at 07/27/2019 0905 Last data filed at 07/27/2019 0500 Gross per 24 hour  Intake 749.9 ml  Output 2825 ml  Net -2075.1 ml   Weight change:   Physical Exam: Gen getting cleaned up in bed NECK No jvd or bruit PULM Chest clear bilaterally CV RRR no MRG Abd soft ntnd no mass or ascites +bsobese Extno LEedema  Neuro confused  Imaging: No results found.  Labs: BMET Recent Labs  Lab 07/22/19 0413  07/23/19 0316 07/23/19 0822 07/24/19 0417 07/24/19 0420 07/24/19 1753 07/25/19 0333 07/25/19 0339 07/25/19 1200 07/26/19 0417 07/27/19 0502  NA 139   < > 139 139 135 137  --  141 139  --  145 154*  K 4.7   < > 4.5 4.4 4.2 4.3  --  5.0 4.9  --  5.4* 5.2*  CL 103  --  106  --   --  104  --  108  --   --  114* 120*  CO2 24  --  19*  --   --  19*  --  18*  --   --  17* 20*  GLUCOSE 271*  --  165*  --   --  130*  --  104*  --   --  115* 63*  BUN 21*  --  38*  --    --  76*  --  109*  --   --  119* 110*  CREATININE 1.74*  --  2.97*  --   --  4.43*  --  4.82*  --   --  4.44* 3.32*  CALCIUM 8.9  --  7.8*  --   --  8.2*  --  8.6*  --   --  8.5* 9.0  PHOS  --   --   --   --   --   --  6.8* 7.0*  --  6.5*  --   --    < > = values in this interval not displayed.   CBC Recent Labs  Lab 07/22/19 0413  07/23/19 0316  07/24/19 0420 07/25/19 0333 07/25/19 0339 07/26/19 0417  WBC 24.4*  --  26.4*  --  18.6* 14.6*  --  12.4*  NEUTROABS 20.9*  --   --   --   --   --   --   --  HGB 17.8*   < > 15.7   < > 13.4 13.3 13.3 13.2  HCT 58.7*   < > 50.9   < > 42.0 41.6 39.0 40.7  MCV 93.3  --  91.5  --  88.6 87.8  --  86.4  PLT 316  --  202  --  170 155  --  139*   < > = values in this interval not displayed.    Medications:    . amLODipine  10 mg Oral Daily  . Chlorhexidine Gluconate Cloth  6 each Topical Daily  . heparin  5,000 Units Subcutaneous Q8H  . hydrALAZINE  50 mg Oral Q8H  . insulin aspart  0-20 Units Subcutaneous Q4H  . ipratropium-albuterol  3 mL Nebulization Q6H  . mouth rinse  15 mL Mouth Rinse BID  . methylPREDNISolone (SOLU-MEDROL) injection  40 mg Intravenous Daily  . sodium zirconium cyclosilicate  10 g Oral Daily      Madelon Lips, MD 07/27/2019, 9:05 AM

## 2019-07-27 NOTE — Progress Notes (Signed)
NAME:  [name], MRN:  [id number], DOB:  1975/11/19, LOS: 5 ADMISSION DATE:  07/22/2019, CONSULTATION DATE:  11/12 REFERRING MD:  Mesner, CHIEF COMPLAINT:  encephalopathy   Brief History   Found down in a motel, nonresponsive to narcan, hypercapniec in the ED, h/o asthma & tobacco abuse  History of present illness   Mr. [name] is a 43 year old gentleman with a history of tobacco abuse and asthma who was found down at a local motel.  He was unresponsive to Narcan administered by EMS.  He was emergently intubated in the ED.  First 2 blood gases have demonstrated significant hypercapnia and respiratory acidosis, which has been refractory to vent changes.  There is no family at bedside to give additional history.  Past Medical History  Asthma Previous pneumothoraces  Significant Hospital Events   11/12 Admission 11/12 intubation 11/12 TLC out  11/12 A Line out  Consults:  none  Pro cedures:   LP   Significant Diagnostic Tests:  11/12 head CT-no intracranial abnormalities, sinusitis (personally reviewed) 11/12 CT spine-no fractures,  (personally reviewed) 11/12 CXR-patchy diffuse infiltrates bilaterally,  (personally reviewed)  Micro Data:  Covid negative 11/12 blood cultures-drawn after antibiotics>> + for GPC>> 11/12 respiratory culture-after antibiotics 11/12 LP cultures 11/12 RVP negative 11/12 CSF Culture 11/12 Strep Pneumonia positive UAg  Antimicrobials:  11/12 ceftriaxone 11/12 azithromycin  Interim history/subjective:   Self extubated Spontaneous diuresis Creatinine is improved Potassium is elevated  Objective   Blood pressure 122/68, pulse 64, temperature 98.3 F (36.8 C), temperature source Axillary, resp. rate (!) 23, height 5\' 11"  (1.803 m), weight (!) 154.4 kg, SpO2 96 %.        Intake/Output Summary (Last 24 hours) at 07/27/2019 0853 Last data filed at 07/27/2019 0500 Gross per 24 hour  Intake 809.52 ml  Output 3075 ml  Net -2265.48 ml    Filed Weights   07/24/19 0415 07/25/19 0440 07/26/19 0350  Weight: (!) 157 kg (!) 156.2 kg (!) 154.4 kg    Examination: General: Morbidly obese male who is intermittently lethargic HEENT: Oral mucosa is dry no JVD or lymphadenopathy is appreciated Neuro: Meandering alertness with periods of poorly arousable but does follow some simple commands CV: Heart sounds regular regular rate and rhythm PULM: Diminished in the bases GI: soft, bsx4 active  Extremities: warm/dry, 1+ edema  Skin: no rashes or lesions   Resolved Hospital Problem list   Lactic acidosis.  Assessment & Plan:   Critical ill due to acute hypoxemic hypercarbic respiratory failure requiring intubation mechanical ventilation, patient with a history of asthma, concern for aspiration pneumonia Patient remains intubated predominantly secondary due to his encephalopathy from listed above conditions.I suspect he has an aspiration pneumonia on top of his overdose after being found down in a motel.  History of asthma No active wheezing. Wean steroids Bronchodilators  Critically ill due to acute toxic metabolic encephalopathy requiring titration of sedative infusions.   Driven by sepsis on background of likely overdose, urine drug screen positive for amphetamines, opiates and marijuana. Now essentially resolved. Continue to monitor  Sepsis due to community-acquired pneumonia, urine antigen positive for pneumococcus Chest x-ray with persistent bilateral infiltrates Antimicrobial therapy is complete  Acute renal failure, Uremia  Lab Results  Component Value Date   CREATININE 3.32 (H) 07/27/2019   CREATININE 4.44 (H) 07/26/2019   CREATININE 4.82 (H) 07/25/2019   Recent Labs  Lab 07/25/19 0339 07/26/19 0417 07/27/19 0502  K 4.9 5.4* 5.2*    Renal ultrasound no  evidence obstruction, appears to be ATN likely related to shock state and hypotension. Creatinine continues to improve although potassium is elevated  Urine output up to 2 L Note the potassium is rising this may be an indication for hemodialysis if no improvement Appreciate nephrology's input  Chronic essential HTN  Currently unable to give oral agents As needed IV agents  History of tobacco abuse Polysubstance abuse Minimize sedation as tolerated    Daily Goals Checklist  Pain/Anxiety/Delirium protocol (if indicated): wean while on SBT. Stop fentanyl prior to extubation and continue Precedex. Stop oral hydromorphone and quetiapine post extubation. VAP protocol (if indicated): bundle in place. Respiratory support goals: extubate if tolerates 2h SBT. Blood pressure target: Tolerate up to SBP 180 while weaning. Adjust oral post extubation DVT prophylaxis: SCD, UFH tid Nutritional status and feeding goals: NPO, Bedside swallow once extubated GI prophylaxis: famotidine Fluid status goals: Spontaneous diuresis. Urinary catheter: Keep Foley until after extubatio and try to transition to urinal Central lines: Arterial and central line removal Glucose control: Monitor only Mobility/therapy needs: OOB chair post extubation. Antibiotic de-escalation: Completed 07/26/1999 Home medication reconciliation: Continue to monitor Daily labs: BMP daily to follow creatinine Code Status: Full Family Communication: Sister is point of contact. Disposition: ICU  Labs   CBC: Recent Labs  Lab 07/22/19 0413  07/23/19 0316  07/24/19 0417 07/24/19 0420 07/25/19 0333 07/25/19 0339 07/26/19 0417  WBC 24.4*  --  26.4*  --   --  18.6* 14.6*  --  12.4*  NEUTROABS 20.9*  --   --   --   --   --   --   --   --   HGB 17.8*   < > 15.7   < > 13.3 13.4 13.3 13.3 13.2  HCT 58.7*   < > 50.9   < > 39.0 42.0 41.6 39.0 40.7  MCV 93.3  --  91.5  --   --  88.6 87.8  --  86.4  PLT 316  --  202  --   --  170 155  --  139*   < > = values in this interval not displayed.    Basic Metabolic Panel: Recent Labs  Lab 07/23/19 0316  07/24/19 0420 07/24/19 1753  07/25/19 0333 07/25/19 0339 07/25/19 1200 07/26/19 0417 07/27/19 0502  NA 139   < > 137  --  141 139  --  145 154*  K 4.5   < > 4.3  --  5.0 4.9  --  5.4* 5.2*  CL 106  --  104  --  108  --   --  114* 120*  CO2 19*  --  19*  --  18*  --   --  17* 20*  GLUCOSE 165*  --  130*  --  104*  --   --  115* 63*  BUN 38*  --  76*  --  109*  --   --  119* 110*  CREATININE 2.97*  --  4.43*  --  4.82*  --   --  4.44* 3.32*  CALCIUM 7.8*  --  8.2*  --  8.6*  --   --  8.5* 9.0  MG  --   --  2.4 2.8* 2.8*  --  2.9* 3.0*  --   PHOS  --   --   --  6.8* 7.0*  --  6.5*  --   --    < > = values in this interval not displayed.  GFR: Estimated Creatinine Clearance: 43.4 mL/min (A) (by C-G formula based on SCr of 3.32 mg/dL (H)). Recent Labs  Lab 07/22/19 0902 07/23/19 0316 07/23/19 0847 07/24/19 0420 07/25/19 0333 07/25/19 0427 07/26/19 0417  PROCALCITON  --   --   --  86.68 58.48  --   --   WBC  --  26.4*  --  18.6* 14.6*  --  12.4*  LATICACIDVEN 6.5*  --  1.3 0.9  --  0.6  --     Liver Function Tests: Recent Labs  Lab 07/22/19 0413 07/23/19 0316 07/24/19 0420 07/25/19 0333  AST 63* 45* 43* 27  ALT 58* 48* 45* 43  ALKPHOS 99 61 54 50  BILITOT 0.4 0.7 0.6 0.6  PROT 8.0 6.4* 6.1* 6.3*  ALBUMIN 3.9 2.8* 2.7* 2.8*   No results for input(s): LIPASE, AMYLASE in the last 168 hours. No results for input(s): AMMONIA in the last 168 hours.  ABG    Component Value Date/Time   PHART 7.246 (L) 07/25/2019 0339   PCO2ART 43.8 07/25/2019 0339   PO2ART 119.0 (H) 07/25/2019 0339   HCO3 19.1 (L) 07/25/2019 0339   TCO2 20 (L) 07/25/2019 0339   ACIDBASEDEF 8.0 (H) 07/25/2019 0339   O2SAT 98.0 07/25/2019 0339     Coagulation Profile: No results for input(s): INR, PROTIME in the last 168 hours.  Cardiac Enzymes: Recent Labs  Lab 07/24/19 1122  CKTOTAL 1,108*    HbA1C: Hgb A1c MFr Bld  Date/Time Value Ref Range Status  07/22/2019 09:02 AM 5.9 (H) 4.8 - 5.6 % Final    Comment:     (NOTE) Pre diabetes:          5.7%-6.4% Diabetes:              >6.4% Glycemic control for   <7.0% adults with diabetes     CBG: Recent Labs  Lab 07/27/19 0158 07/27/19 0318 07/27/19 0320 07/27/19 0326 07/27/19 0346  GLUCAP 194* 38* 34* 56* 86    CRITICAL CARE App CCT 30 min  Richardson Landry  ACNP [facility]

## 2019-07-27 NOTE — Progress Notes (Signed)
Hypoglycemic Event  CBG: 45  Treatment: One amp D50  Symptoms: None  Follow-up CBG: [id number] CBG Result:80  Possible Reasons for Event: NPO  Comments/MD notified:MD aware    Netta Corrigan

## 2019-07-27 NOTE — Plan of Care (Signed)
  Problem: Clinical Measurements: Goal: Respiratory complications will improve Outcome: Progressing Note: Pt remains extubated and is currently on 2L nasal cannula with oxygen saturations in the mid 90s.    Problem: Activity: Goal: Risk for activity intolerance will decrease Outcome: Progressing Note: PT/OT ordered and worked with patient today. Pt is able to mobilize well but he is a very high fall risk due to confusion and restlessness.   Problem: Elimination: Goal: Will not experience complications related to bowel motility Outcome: Progressing Note: Bm today Goal: Will not experience complications related to urinary retention Outcome: Progressing   Problem: Safety: Goal: Ability to remain free from injury will improve Outcome: Progressing   Problem: Skin Integrity: Goal: Risk for impaired skin integrity will decrease Outcome: Progressing   Problem: Nutrition: Goal: Adequate nutrition will be maintained Outcome: Not Progressing Note: Pt failed bedside swallow evaluation and barium swallow evaluation. He can only have applesauce at this time. RN will address possible cortrak/NG with MD   Problem: Activity: Goal: Ability to tolerate increased activity will improve Outcome: Completed/Met   Problem: Respiratory: Goal: Ability to maintain a clear airway and adequate ventilation will improve Outcome: Completed/Met   Problem: Role Relationship: Goal: Method of communication will improve Outcome: Completed/Met

## 2019-07-27 NOTE — Evaluation (Signed)
Occupational Therapy Evaluation Patient Details Name: [name] MRN: [id number] DOB: 07-17-1976 Today's Date: 07/27/2019    History of Present Illness [name] is a 43 year old gentleman with a history of tobacco abuse and asthma who was found down at a local motel. Intubated 11/12 and self extubated 11/16. Acute toxic metabolic encephalopathy requiring titration of sedative infusions.  Driven by sepsis on background of likely overdose, urine drug screen positive for amphetamines, opiates and marijuana;Sepsis due to community-acquired pneumonia   Clinical Impression   This 43 yo male admitted with above presents to acute OT with lethargy, decreased command following, restlessness, decreased mobility, and generalized weakness all affecting his safety and independence with basic ADLs. He will benefit from acute OT with follow up at SNF to work back towards PLOF. Pt is currently min -+2 mod A for mobility and total A for basic ADLs.    Follow Up Recommendations  SNF;Supervision/Assistance - 24 hour    Equipment Recommendations  Other (comment)(TBD next vneue)       Precautions / Restrictions Precautions Precautions: Fall Restrictions Weight Bearing Restrictions: No      Mobility Bed Mobility Overal bed mobility: Needs Assistance Bed Mobility: Supine to Sit     Supine to sit: Min assist;+2 for physical assistance;+2 for safety/equipment(with bed in chair position)     General bed mobility comments: Used foot exit for sit<>stand; total A +2 to scoot to [facility] with bed in trendelenberg  Transfers Overall transfer level: Needs assistance Equipment used: 2 person hand held assist Transfers: Sit to/from Stand Sit to Stand: Mod assist;+2 physical assistance         General transfer comment: from chair/foot exit with increased time and momentum    Balance Overall balance assessment: Needs assistance Sitting-balance support: Bilateral upper extremity supported;Feet  supported Sitting balance-Leahy Scale: Poor     Standing balance support: Bilateral upper extremity supported Standing balance-Leahy Scale: Poor                             ADL either performed or assessed with clinical judgement   ADL                                         General ADL Comments: total A     Vision Baseline Vision/History: Wears glasses Wears Glasses: Reading only              Pertinent Vitals/Pain Pain Assessment: Faces Faces Pain Scale: No hurt     Hand Dominance Right   Extremity/Trunk Assessment Upper Extremity Assessment Upper Extremity Assessment: Generalized weakness           Communication Communication Communication: Expressive difficulties(mumbles, just extubated yesterday)   Cognition Arousal/Alertness: Lethargic;Suspect due to medications(pt on precedex) Behavior During Therapy: Restless Overall Cognitive Status: No family/caregiver present to determine baseline cognitive functioning Area of Impairment: Orientation;Following commands;Safety/judgement;Problem solving                 Orientation Level: Disoriented to;Place;Time     Following Commands: Follows one step commands inconsistently Safety/Judgement: Decreased awareness of safety;Decreased awareness of deficits   Problem Solving: Slow processing;Decreased initiation;Difficulty sequencing;Requires tactile cues;Requires verbal cues General Comments: Pt currently lethargic; year he said 74.Would suspect no issues with cognition pta due to patient living by himself in motel per chart  Home Living                                   Additional Comments: Per chart pt is from hotel      Prior Functioning/Environment Level of Independence: Independent                 OT Problem List: Decreased strength;Impaired balance (sitting and/or standing);Obesity;Decreased safety awareness;Decreased cognition       OT Treatment/Interventions: Self-care/ADL training;Energy conservation;Patient/family education;Balance training;Cognitive remediation/compensation;Therapeutic activities;DME and/or AE instruction    OT Goals(Current goals can be found in the care plan section) Acute Rehab OT Goals Patient Stated Goal: wants water OT Goal Formulation: With patient Time For Goal Achievement: 08/10/19 Potential to Achieve Goals: Good  OT Frequency: Min 2X/week   Barriers to D/C: Decreased caregiver support             AM-PAC OT "6 Clicks" Daily Activity     Outcome Measure Help from another person eating meals?: Total(npo) Help from another person taking care of personal grooming?: Total Help from another person toileting, which includes using toliet, bedpan, or urinal?: Total Help from another person bathing (including washing, rinsing, drying)?: Total Help from another person to put on and taking off regular upper body clothing?: Total Help from another person to put on and taking off regular lower body clothing?: Total 6 Click Score: 6   End of Session Equipment Utilized During Treatment: Gait belt  Activity Tolerance: Patient limited by lethargy Patient left: in bed;with call bell/phone within reach;with bed alarm set;with restraints reapplied(Bil wrists)  OT Visit Diagnosis: Unsteadiness on feet (R26.81);Other abnormalities of gait and mobility (R26.89);Muscle weakness (generalized) (M62.81);Other symptoms and signs involving cognitive function                Time: [id number] OT Time Calculation (min): 15 min Charges:  OT General Charges $OT Visit: 1 Visit OT Evaluation $OT Eval Moderate Complexity: 1 Mod  [address], OTR/L Acute [employer] Pager [phone] Office [phone]     Almon Register 07/27/2019, 11:13 AM
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
## 2019-07-27 NOTE — Plan of Care (Signed)
  Problem: Nutrition: Goal: Adequate nutrition will be maintained Outcome: Progressing   Problem: Elimination: Goal: Will not experience complications related to urinary retention Outcome: Progressing   Problem: Pain Managment: Goal: General experience of comfort will improve Outcome: Progressing   Problem: Safety: Goal: Ability to remain free from injury will improve Outcome: Progressing   

## 2019-07-27 NOTE — Progress Notes (Addendum)
Modified Barium Swallow Progress Note  Patient Details  Name: [name] MRN: [id number] Date of Birth: June 22, 1976  Today's Date: 07/27/2019  Modified Barium Swallow completed.  Full report located under Chart Review in the Imaging Section.  Brief recommendations include the following:  Clinical Impression  Pt demonstrates a moderate primary sensory based acute reversible dysphagia following intubation with self extubation. Pt is impulsive and restless during exam, behavior somewhat difficult to manage. Despite cueing he throws his head back to drink, there is premature spillage to the pharynx and slightly delayed laryngeal closure. Suspect incomplete glottic closure given frequent aspiration events at the posterior commisure. Aspiration during swallow with nectar and honey was immediately squeezed out, but significant silent aspiration occurred after the swallow of barium residue accumulating in the pharynx, mixed with secretions. Given pts behavior, impulsivity, decreased airway protection and lack of sensation, pt is not recommended to consume any liquids at this time. He could take some purees for comfort or ice after oral care with close supervision from RN. Would advise short term alternate nutrition. Pt may need another MBS prior to diet initaition when mentation improves.    Swallow Evaluation Recommendations       SLP Diet Recommendations: NPO;Alternative means - temporary       Medication Administration: Via alternative means                       Herbie Baltimore, MA CCC-SLP  Acute Rehabilitation Services Pager [phone] Office [phone]  Lynann Beaver 07/27/2019,2:06 PM

## 2019-07-27 NOTE — Progress Notes (Signed)
Pt pulled out PIV and lost all IV access. Pt refusing to allow RN to replace IV. Warren Lacy MD notified. Will continue to monitor. Clint Bolder, RN 07/27/19 1:22 AM

## 2019-07-27 NOTE — Evaluation (Signed)
Physical Therapy Evaluation Patient Details Name: [name] MRN: [id number] DOB: [birth year] Today's Date: 07/27/2019   History of Present Illness  Mr. Kitchin is a 43 year old gentleman with a history of tobacco abuse and asthma who was found down at a local motel. Intubated 11/12 and self extubated 11/16. Acute toxic metabolic encephalopathy requiring titration of sedative infusions.  Driven by sepsis on background of likely overdose, urine drug screen positive for amphetamines, opiates and marijuana;Sepsis due to community-acquired pneumonia  Clinical Impression  Currently unable to determine prior level of function and home living situation. Pt is currently limited in safe mobility by decreased cognition and safety awareness in presence of decreased strength and balance. Utilized bed foot egress function to determine current mobility, pt requires total A for scooting to HoB, min A x2 for pulling himself to upright away from bed surface in chair position. Pt modAx2 for sit>stand egression from foot of bed. PT recommending SNF level rehab at discharge. PT will continue to follow acutely.    Follow Up Recommendations SNF    Equipment Recommendations  Other (comment)(TBD at next venue)    Recommendations for Other Services       Precautions / Restrictions Precautions Precautions: Fall Restrictions Weight Bearing Restrictions: No      Mobility  Bed Mobility Overal bed mobility: Needs Assistance Bed Mobility: Supine to Sit     Supine to sit: Min assist;+2 for physical assistance;+2 for safety/equipment(with bed in chair position)     General bed mobility comments: Used foot exit for sit<>stand; total A +2 to scoot to [facility] with bed in trendelenberg  Transfers Overall transfer level: Needs assistance Equipment used: 2 person hand held assist Transfers: Sit to/from Stand Sit to Stand: Mod assist;+2 physical assistance         General transfer comment: from chair/foot exit  with increased time and momentum        Balance Overall balance assessment: Needs assistance Sitting-balance support: Bilateral upper extremity supported;Feet supported Sitting balance-Leahy Scale: Poor     Standing balance support: Bilateral upper extremity supported Standing balance-Leahy Scale: Poor                               Pertinent Vitals/Pain Pain Assessment: Faces Faces Pain Scale: No hurt    Home Living                   Additional Comments: Per chart pt is from hotel    Prior Function Level of Independence: Independent               Hand Dominance   Dominant Hand: Right    Extremity/Trunk Assessment   Upper Extremity Assessment Upper Extremity Assessment: Defer to OT evaluation    Lower Extremity Assessment Lower Extremity Assessment: Generalized weakness;Difficult to assess due to impaired cognition       Communication   Communication: Expressive difficulties(mumbles, just extubated yesterday)  Cognition Arousal/Alertness: Lethargic;Suspect due to medications(pt on precedex) Behavior During Therapy: Restless Overall Cognitive Status: No family/caregiver present to determine baseline cognitive functioning Area of Impairment: Orientation;Following commands;Safety/judgement;Problem solving                 Orientation Level: Disoriented to;Place;Time     Following Commands: Follows one step commands inconsistently Safety/Judgement: Decreased awareness of safety;Decreased awareness of deficits   Problem Solving: Slow processing;Decreased initiation;Difficulty sequencing;Requires tactile cues;Requires verbal cues General Comments: Pt currently lethargic; year he said 16.Would suspect  no issues with cognition pta due to patient living by himself in motel per chart      General Comments General comments (skin integrity, edema, etc.): VSS, pt with increased mobility in bed         Assessment/Plan    PT  Assessment Patient needs continued PT services  PT Problem List Decreased strength;Decreased range of motion;Decreased activity tolerance;Decreased balance;Decreased mobility;Decreased cognition;Decreased safety awareness;Cardiopulmonary status limiting activity       PT Treatment Interventions DME instruction;Gait training;Stair training;Functional mobility training;Therapeutic activities;Therapeutic exercise;Balance training;Cognitive remediation;Patient/family education    PT Goals (Current goals can be found in the Care Plan section)  Acute Rehab PT Goals Patient Stated Goal: wants water PT Goal Formulation: Patient unable to participate in goal setting Time For Goal Achievement: 08/10/19 Potential to Achieve Goals: Fair    Frequency Min 2X/week    AM-PAC PT "6 Clicks" Mobility  Outcome Measure Help needed turning from your back to your side while in a flat bed without using bedrails?: None Help needed moving from lying on your back to sitting on the side of a flat bed without using bedrails?: None Help needed moving to and from a bed to a chair (including a wheelchair)?: A Lot Help needed standing up from a chair using your arms (e.g., wheelchair or bedside chair)?: A Lot Help needed to walk in hospital room?: Total Help needed climbing 3-5 steps with a railing? : Total 6 Click Score: 14    End of Session Equipment Utilized During Treatment: Oxygen Activity Tolerance: Patient limited by lethargy;Treatment limited secondary to agitation Patient left: in bed;with call bell/phone within reach;with bed alarm set;with restraints reapplied Nurse Communication: Mobility status PT Visit Diagnosis: Unsteadiness on feet (R26.81);Other abnormalities of gait and mobility (R26.89);Muscle weakness (generalized) (M62.81);Difficulty in walking, not elsewhere classified (R26.2)    Time: [id number] PT Time Calculation (min) (ACUTE ONLY): 16 min   Charges:   PT Evaluation $PT Eval Moderate  Complexity: 1 Mod          Ellin Fitzgibbons B. Migdalia Dk PT, DPT Acute Rehabilitation Services Pager [phone] Office [phone]   [place] 07/27/2019, 11:40 AM

## 2019-07-28 ENCOUNTER — Inpatient Hospital Stay (HOSPITAL_COMMUNITY): Payer: Self-pay

## 2019-07-28 DIAGNOSIS — J4541 Moderate persistent asthma with (acute) exacerbation: Secondary | ICD-10-CM

## 2019-07-28 LAB — PHOSPHORUS
Phosphorus: 4 mg/dL (ref 2.5–4.6)
Phosphorus: 4.3 mg/dL (ref 2.5–4.6)

## 2019-07-28 LAB — GLUCOSE, CAPILLARY
Glucose-Capillary: 137 mg/dL — ABNORMAL HIGH (ref 70–99)
Glucose-Capillary: 128 mg/dL — ABNORMAL HIGH (ref 70–99)
Glucose-Capillary: 176 mg/dL — ABNORMAL HIGH (ref 70–99)
Glucose-Capillary: 143 mg/dL — ABNORMAL HIGH (ref 70–99)
Glucose-Capillary: 150 mg/dL — ABNORMAL HIGH (ref 70–99)
Glucose-Capillary: 162 mg/dL — ABNORMAL HIGH (ref 70–99)

## 2019-07-28 LAB — BASIC METABOLIC PANEL
Anion gap: 11 (ref 5–15)
BUN: 80 mg/dL — ABNORMAL HIGH (ref 6–20)
CO2: 20 mmol/L — ABNORMAL LOW (ref 22–32)
Calcium: 8.5 mg/dL — ABNORMAL LOW (ref 8.9–10.3)
Chloride: 121 mmol/L — ABNORMAL HIGH (ref 98–111)
Creatinine, Ser: 2.07 mg/dL — ABNORMAL HIGH (ref 0.61–1.24)
GFR calc Af Amer: 44 mL/min — ABNORMAL LOW (ref >60)
GFR calc non Af Amer: 38 mL/min — ABNORMAL LOW (ref >60)
Glucose, Bld: 151 mg/dL — ABNORMAL HIGH (ref 70–99)
Potassium: 4.6 mmol/L (ref 3.5–5.1)
Sodium: 152 mmol/L — ABNORMAL HIGH (ref 135–145)

## 2019-07-28 LAB — SODIUM: Sodium: 151 mmol/L — ABNORMAL HIGH (ref 135–145)

## 2019-07-28 LAB — MAGNESIUM
Magnesium: 2.3 mg/dL (ref 1.7–2.4)
Magnesium: 2.1 mg/dL (ref 1.7–2.4)

## 2019-07-28 MED ORDER — LIDOCAINE VISCOUS HCL 2 % MT SOLN
OROMUCOSAL | Status: AC
  Administered 2019-07-28: 15:00:00 3 mL via OROMUCOSAL
  Filled 2019-07-28: qty 15

## 2019-07-28 MED ORDER — HYDRALAZINE HCL 50 MG PO TABS
50.0000 mg | ORAL_TABLET | Freq: Three times a day (TID) | ORAL | Status: DC
  Administered 2019-07-29 – 2019-08-01 (×10): 50 mg
  Filled 2019-07-28 (×10): qty 1

## 2019-07-28 MED ORDER — CHLORHEXIDINE GLUCONATE 0.12 % MT SOLN
15.0000 mL | Freq: Two times a day (BID) | OROMUCOSAL | Status: DC
  Administered 2019-07-28 – 2019-07-31 (×8): 15 mL via OROMUCOSAL
  Filled 2019-07-28 (×6): qty 15

## 2019-07-28 MED ORDER — IOHEXOL 300 MG/ML  SOLN
50.0000 mL | Freq: Once | INTRAMUSCULAR | Status: AC | PRN
  Administered 2019-07-28: 15 mL

## 2019-07-28 MED ORDER — CLONAZEPAM 0.5 MG PO TBDP
0.5000 mg | ORAL_TABLET | Freq: Two times a day (BID) | ORAL | Status: DC
  Administered 2019-07-29: 08:00:00 0.5 mg
  Filled 2019-07-28: qty 1

## 2019-07-28 MED ORDER — OSMOLITE 1.5 CAL PO LIQD
1000.0000 mL | ORAL | Status: DC
  Administered 2019-07-28 – 2019-07-29 (×2): 1000 mL
  Filled 2019-07-28 (×6): qty 1000

## 2019-07-28 MED ORDER — CLONAZEPAM 0.5 MG PO TBDP
0.5000 mg | ORAL_TABLET | Freq: Two times a day (BID) | ORAL | Status: DC
  Administered 2019-07-28 (×2): 0.5 mg via ORAL
  Filled 2019-07-28 (×2): qty 1

## 2019-07-28 MED ORDER — LORAZEPAM 2 MG/ML IJ SOLN: 1.0000 mg | INTRAMUSCULAR | Status: DC | PRN

## 2019-07-28 MED ORDER — AMLODIPINE BESYLATE 10 MG PO TABS
10.0000 mg | ORAL_TABLET | Freq: Every day | ORAL | Status: DC
  Administered 2019-07-29 – 2019-07-31 (×3): 10 mg
  Filled 2019-07-28 (×3): qty 1

## 2019-07-28 MED ORDER — LIDOCAINE VISCOUS HCL 2 % MT SOLN
6.0000 mL | Freq: Once | OROMUCOSAL | Status: AC
  Administered 2019-07-28: 15:00:00 3 mL via OROMUCOSAL

## 2019-07-28 MED ORDER — DEXTROSE 5 % IV SOLN
INTRAVENOUS | Status: AC
  Administered 2019-07-28: 500 mL/h via INTRAVENOUS

## 2019-07-28 MED ORDER — QUETIAPINE FUMARATE 50 MG PO TABS
50.0000 mg | ORAL_TABLET | Freq: Two times a day (BID) | ORAL | Status: DC
  Administered 2019-07-29 – 2019-08-01 (×6): 50 mg
  Filled 2019-07-28 (×6): qty 1

## 2019-07-28 MED ORDER — PRO-STAT SUGAR FREE PO LIQD
30.0000 mL | Freq: Three times a day (TID) | ORAL | Status: DC
  Administered 2019-07-28 – 2019-08-01 (×9): 30 mL
  Filled 2019-07-28 (×9): qty 30

## 2019-07-28 MED ORDER — ORAL CARE MOUTH RINSE
15.0000 mL | Freq: Two times a day (BID) | OROMUCOSAL | Status: DC
  Administered 2019-07-28 – 2019-07-30 (×4): 15 mL via OROMUCOSAL

## 2019-07-28 NOTE — Progress Notes (Signed)
 Nutrition Follow-up  DOCUMENTATION CODES:   Morbid obesity  INTERVENTION:   Tube Feeding:  Osmolite 1.5 at 55 ml/hr Pro-Stat 30 mL TID Provides 128 g of protein, 2280 kcals and 1003 mL of free water Meets 100% of estimated calorie and protein needs  NUTRITION DIAGNOSIS:   Inadequate oral intake related to inability to eat as evidenced by NPO status.  Being addressed via TF   GOAL:   Provide needs based on ASPEN/SCCM guidelines  Progressing  MONITOR:   Vent status, Labs, Weight trends, TF tolerance, I & O's  REASON FOR ASSESSMENT:   Consult Enteral/tube feeding initiation and management  ASSESSMENT:   43 year old male who presented to the ED on 11/12 after being found down in a motel. PMH of tobacco abuse and asthma. Pt required emergent intubation in the ED. Pt found to have sepsis due to CAP.  11/16 Extubated  AMS continues, pt agitated when not sedated  Pt remains NPO. SLP following, not appropriate/safe for diet advancement at this time 10 french feeding tube placed today in radiology  Labs: sodium 152 (H), Creatinine 2.07, BUN 80 Meds: D5 at 125 ml/hr, precedex    Diet Order:   Diet Order            Diet NPO time specified  Diet effective now              EDUCATION NEEDS:   No education needs have been identified at this time  Skin:  Skin Assessment: Reviewed RN Assessment  Last BM:  no documented BM  Height:   Ht Readings from Last 1 Encounters:  07/22/19 5\' 11"  (1.803 m)    Weight:   Wt Readings from Last 1 Encounters:  07/26/19 (!) 154.4 kg    Ideal Body Weight:  78.2 kg  BMI:  Body mass index is 47.47 kg/m.  Estimated Nutritional Needs:   Kcal:  2100-2400 kcals  Protein:  120-150 g  Fluid:  >/= 2 L   Reichen Hutzler MS, RDN, LDN, CNSC [phone] Pager  [phone] Weekend/On-Call Pager

## 2019-07-28 NOTE — Progress Notes (Signed)
NAME:  [name], MRN:  [id number], DOB:  [birth year], LOS: 6 ADMISSION DATE:  07/22/2019, CONSULTATION DATE:  11/12 REFERRING MD:  Mesner, CHIEF COMPLAINT:  encephalopathy   Brief History   Found down in a motel, nonresponsive to narcan, hypercapniec in the ED, h/o asthma & tobacco abuse  History of present illness   [name] is a 43 year old gentleman with a history of tobacco abuse and asthma who was found down at a local motel.  He was unresponsive to Narcan administered by EMS.  He was emergently intubated in the ED.  First 2 blood gases have demonstrated significant hypercapnia and respiratory acidosis, which has been refractory to vent changes.  There is no family at bedside to give additional history.  Past Medical History  Asthma Previous pneumothoraces  Significant Hospital Events   11/12 Admission 11/12 intubation 11/12 TLC out  11/12 A Line out 11/16 extubated  Consults:  none  Procedures:   LP   Significant Diagnostic Tests:  11/12 head CT-no intracranial abnormalities, sinusitis (personally reviewed) 11/12 CT spine-no fractures,  (personally reviewed) 11/12 CXR-patchy diffuse infiltrates bilaterally,  (personally reviewed) 11/18 chest x-ray-personally reviewed showing stable interstitial infiltration  Micro Data:  Covid negative 11/12 blood cultures-drawn after antibiotics>> + for GPC>> 11/12 respiratory culture-after antibiotics 11/12 LP cultures 11/12 RVP negative 11/12 CSF Culture 11/12 Strep Pneumonia positive UAg  Antimicrobials:  11/11-11/14 ceftriaxone 11/12 azithromycin 11/14 -11/15 Unasyn 11/12-11/13 vancomycin  Interim history/subjective:   Self extubated-11/16 Sedate, on Precedex  Objective   Blood pressure 140/72, pulse 65, temperature 98.6 F (37 C), temperature source Axillary, resp. rate (!) 24, height 5\' 11"  (1.803 m), weight (!) 154.4 kg, SpO2 94 %.        Intake/Output Summary (Last 24 hours) at 07/28/2019 0830 Last  data filed at 07/28/2019 0600 Gross per 24 hour  Intake 2994.87 ml  Output 2525 ml  Net 469.87 ml   Filed Weights   07/24/19 0415 07/25/19 0440 07/26/19 0350  Weight: (!) 157 kg (!) 156.2 kg (!) 154.4 kg    Examination: General: Obese, sedated  HEENT: Dry oral mucosa Neuro: Sedated CV: S1-S2 appreciated PULM: Decreased breath sounds bilaterally GI: Bowel sounds appreciated Extremities: Warm and dry, 1+ edema Skin: no rashes or lesions   Resolved Hospital Problem list   Lactic acidosis.  Assessment & Plan:   Acute hypoxemic/hypercarbic respiratory failure History of asthma Concern for aspiration pneumonia Self extubated 11/16 -Continue to monitor -On Precedex for sedation -Continue oxygen supplementation -Continue bronchodilators -Off steroids  Asthma -Bronchodilators  Encephalopathy -Difficult to assess at present secondary to patient's agitation requiring sedation -Some agitation overnight requiring max dose of Precedex, Ativan as needed -Add Klonopin -Continue Seroquel  Illicit substance use -U tox positive for amphetamines, opiates, marijuana -Monitor closely  Sepsis secondary to community-acquired pneumonia -Completed antimicrobials  Acute kidney injury Hypernatremia -Trend electrolytes -Creatinine improving -Appreciate renal input -On D5 water -Will give a bolus of 500 cc D5 water  Hypertension -Monitor closely  History of tobacco abuse Polysubstance abuse -Counseling when appropriate  Nutrition -For follow-up by speech -Medications with applesauce   Daily Goals Checklist  Pain/Anxiety/Delirium protocol (if indicated): Continue Precedex VAP protocol: Not indicated DVT prophylaxis: SCD, UFH tid Nutritional status and feeding goals: N.p.o. status still able to evaluate swallowing GI prophylaxis: Pepcid Urinary catheter: Keep Foley until after extubatio and try to transition to urinal Glucose control: Monitor only Mobility/therapy  needs: Bedrest Home medication reconciliation: Continue to monitor Daily labs: BMP daily to follow  creatinine Code Status: Full Family Communication: Sister is point of contact. Disposition: ICU  Labs   CBC: Recent Labs  Lab 07/22/19 0413  07/23/19 0316  07/24/19 0417 07/24/19 0420 07/25/19 0333 07/25/19 0339 07/26/19 0417  WBC 24.4*  --  26.4*  --   --  18.6* 14.6*  --  12.4*  NEUTROABS 20.9*  --   --   --   --   --   --   --   --   HGB 17.8*   < > 15.7   < > 13.3 13.4 13.3 13.3 13.2  HCT 58.7*   < > 50.9   < > 39.0 42.0 41.6 39.0 40.7  MCV 93.3  --  91.5  --   --  88.6 87.8  --  86.4  PLT 316  --  202  --   --  170 155  --  139*   < > = values in this interval not displayed.    Basic Metabolic Panel: Recent Labs  Lab 07/24/19 0420 07/24/19 1753 07/25/19 0333 07/25/19 0339 07/25/19 1200 07/26/19 0417 07/27/19 0502 07/27/19 1238 07/27/19 1828 07/28/19 0326  NA 137  --  141 139  --  145 154* 155* 157* 152*  K 4.3  --  5.0 4.9  --  5.4* 5.2*  --   --  4.6  CL 104  --  108  --   --  114* 120*  --   --  121*  CO2 19*  --  18*  --   --  17* 20*  --   --  20*  GLUCOSE 130*  --  104*  --   --  115* 63*  --   --  151*  BUN 76*  --  109*  --   --  119* 110*  --   --  80*  CREATININE 4.43*  --  4.82*  --   --  4.44* 3.32*  --   --  2.07*  CALCIUM 8.2*  --  8.6*  --   --  8.5* 9.0  --   --  8.5*  MG 2.4 2.8* 2.8*  --  2.9* 3.0*  --   --   --  2.3  PHOS  --  6.8* 7.0*  --  6.5*  --   --   --   --  4.0   GFR: Estimated Creatinine Clearance: 69.6 mL/min (A) (by C-G formula based on SCr of 2.07 mg/dL (H)). Recent Labs  Lab 07/22/19 0902 07/23/19 0316 07/23/19 0847 07/24/19 0420 07/25/19 0333 07/25/19 0427 07/26/19 0417  PROCALCITON  --   --   --  86.68 58.48  --   --   WBC  --  26.4*  --  18.6* 14.6*  --  12.4*  LATICACIDVEN 6.5*  --  1.3 0.9  --  0.6  --     Liver Function Tests: Recent Labs  Lab 07/22/19 0413 07/23/19 0316 07/24/19 0420 07/25/19 0333  AST  63* 45* 43* 27  ALT 58* 48* 45* 43  ALKPHOS 99 61 54 50  BILITOT 0.4 0.7 0.6 0.6  PROT 8.0 6.4* 6.1* 6.3*  ALBUMIN 3.9 2.8* 2.7* 2.8*   No results for input(s): LIPASE, AMYLASE in the last 168 hours. No results for input(s): AMMONIA in the last 168 hours.  ABG    Component Value Date/Time   PHART 7.246 (L) 07/25/2019 0339   PCO2ART 43.8 07/25/2019 0339   PO2ART 119.0 (H) 07/25/2019  [id number]   HCO3 19.1 (L) 07/25/2019 0339   TCO2 20 (L) 07/25/2019 0339   ACIDBASEDEF 8.0 (H) 07/25/2019 0339   O2SAT 98.0 07/25/2019 0339     Coagulation Profile: No results for input(s): INR, PROTIME in the last 168 hours.  Cardiac Enzymes: Recent Labs  Lab 07/24/19 1122  CKTOTAL 1,108*    HbA1C: Hgb A1c MFr Bld  Date/Time Value Ref Range Status  07/22/2019 09:02 AM 5.9 (H) 4.8 - 5.6 % Final    Comment:    (NOTE) Pre diabetes:          5.7%-6.4% Diabetes:              >6.4% Glycemic control for   <7.0% adults with diabetes     CBG: Recent Labs  Lab 07/27/19 1552 07/27/19 2000 07/27/19 2335 07/28/19 0331 07/28/19 0752  GLUCAP 146* 91 157* 137* 128*    The patient is critically ill with multiple organ systems failure and requires high complexity decision making for assessment and support, frequent evaluation and titration of therapies, application of advanced monitoring technologies and extensive interpretation of multiple databases. Critical Care Time devoted to patient care services described in this note independent of APP/resident time (if applicable)  is 31 minutes.   Sherrilyn Rist MD [facility] Personal pager: [phone] If unanswered, please page CCM On-call: [phone]

## 2019-07-28 NOTE — Progress Notes (Signed)
Cortrak Tube Team Note:  Contacted by Radiology to place bridle.   Radiology placed 10 fr feeding tube in right nare. RD secured tube successfully in place with bridle.   BorgWarner MS, RDN, LDN, CNSC [phone] Pager  [phone] Weekend/On-Call Pager

## 2019-07-28 NOTE — Progress Notes (Signed)
  Speech Language Pathology Treatment: Dysphagia  Patient Details Name: [name] MRN: [id number] DOB: 06-Oct-1975 Today's Date: 07/28/2019 Time: [id number] SLP Time Calculation (min) (ACUTE ONLY): 15 min  Assessment / Plan / Recommendation Clinical Impression  Pt is lethargic this morning but therefore not as restless and mobile during PO trials as he was during MBS. He has prolonged oral manipulation of ice chips and needs Mod cues to initiate a swallow. He coughs and ejects a small amount of secretions with use of yankauer. Education was provided to pt and family about current level of function and treatment plan (also reviewed with RN). Would still remain NPO except for ice chips after oral care or a few bites of puree given with careful assistance when alert and upright.   HPI HPI: [name] is a 43 year old gentleman with a history of tobacco abuse and asthma who was found down [place] a local motel.  He was unresponsive to Narcan administered by EMS.  He was emergently intubated in the ED.  Self extubated 11/16.  CXR suspicious for pna.      SLP Plan  Continue with current plan of care       Recommendations  Diet recommendations: NPO;Other(comment)(few bites of puree OR ice chips after oral care with assista) Medication Administration: Via alternative means                Oral Care Recommendations: Oral care QID Follow up Recommendations: Inpatient Rehab SLP Visit Diagnosis: Dysphagia, oropharyngeal phase (R13.12) Plan: Continue with current plan of care       GO                [name] [name] 07/28/2019, 12:29 PM  [name], M.A. [facility] Acute Environmental education officer [phone] Office [phone]

## 2019-07-28 NOTE — Progress Notes (Signed)
[facility] Progress Note    Assessment/ Plan:   1. Acute renal failure - nonoliguric, AKI most likely due to shock, +/- vanc toxicity. Urine lytes suggest ATN. There is no indication for RRT at this time. IV Vanc dc'd. BP's good and UOP good. Creatinine downtrending with post-ATN diuresis likely contributing to hypernatremia. 2. Hypernatremia: on D5W @ 125 mL/ hr, increased, continue to monitor with serial na checks.  Try to do strict I/O to assess for ongoing polyuria (would raise suspicion of DI).   3. Hypoglycemia: likely poor oral intake, D5W as above  4. Resp failure - self-extubated 11/16 5. Drug abuse - found down, +mult drugs on UDS, poss overdose 6. CAD/ sepsis - on Unasyn, urine Ag + for pneumococcus. 7. Hyperkalemia: mild, resolved with lokelma, will stop now 8. Dispo: pending   Subjective:    Cr coming down, doing well.  Na still up 152.     Objective:   BP 140/72   Pulse 65   Temp 97.8 F (36.6 C) (Oral)   Resp (!) 24   Ht 5\' 11"  (1.803 m)   Wt (!) 154.4 kg   SpO2 94%   BMI 47.47 kg/m   Intake/Output Summary (Last 24 hours) at 07/28/2019 1332 Last data filed at 07/28/2019 1200 Gross per 24 hour  Intake 2582.7 ml  Output 2580 ml  Net 2.7 ml   Weight change:   Physical Exam: Gen NAD, sitting in bed NECK No jvd or bruit PULM bilateral expiratory rhonchi CV RRR no MRG Abd soft ntnd no mass or ascites +bsobese Extno LEedema  Neuro confused  Imaging: Am Dg Chest Port 1 View  Result Date: 07/28/2019 CLINICAL DATA:  43 year old male with respiratory failure. EXAM: PORTABLE CHEST 1 VIEW COMPARISON:  Chest radiograph dated 07/25/2019. FINDINGS: There has been interval removal of the support apparatus and right IJ central line. There is mild cardiomegaly. Diffuse interstitial prominence may represent vascular congestion/edema although atypical pneumonia is not excluded. Clinical correlation is recommended. No focal consolidation, pleural  effusion, pneumothorax. Multiple internally fixed left rib fractures noted. No acute osseous pathology. IMPRESSION: 1. Cardiomegaly with probable mild vascular congestion. No focal consolidation. 2. Multiple internally fixed left rib fractures.  No pneumothorax. Electronically Signed   By: Anner Crete M.D.   On: 07/28/2019 08:42    Labs: BMET Recent Labs  Lab 07/22/19 0413  07/23/19 0316  07/24/19 0417 07/24/19 0420 07/24/19 1753 07/25/19 0333 07/25/19 0339 07/25/19 1200 07/26/19 0417 07/27/19 0502 07/27/19 1238 07/27/19 1828 07/28/19 0326  NA 139   < > 139   < > 135 137  --  141 139  --  145 154* 155* 157* 152*  K 4.7   < > 4.5   < > 4.2 4.3  --  5.0 4.9  --  5.4* 5.2*  --   --  4.6  CL 103  --  106  --   --  104  --  108  --   --  114* 120*  --   --  121*  CO2 24  --  19*  --   --  19*  --  18*  --   --  17* 20*  --   --  20*  GLUCOSE 271*  --  165*  --   --  130*  --  104*  --   --  115* 63*  --   --  151*  BUN 21*  --  38*  --   --  76*  --  109*  --   --  119* 110*  --   --  80*  CREATININE 1.74*  --  2.97*  --   --  4.43*  --  4.82*  --   --  4.44* 3.32*  --   --  2.07*  CALCIUM 8.9  --  7.8*  --   --  8.2*  --  8.6*  --   --  8.5* 9.0  --   --  8.5*  PHOS  --   --   --   --   --   --  6.8* 7.0*  --  6.5*  --   --   --   --  4.0   < > = values in this interval not displayed.   CBC Recent Labs  Lab 07/22/19 0413  07/23/19 0316  07/24/19 0420 07/25/19 0333 07/25/19 0339 07/26/19 0417  WBC 24.4*  --  26.4*  --  18.6* 14.6*  --  12.4*  NEUTROABS 20.9*  --   --   --   --   --   --   --   HGB 17.8*   < > 15.7   < > 13.4 13.3 13.3 13.2  HCT 58.7*   < > 50.9   < > 42.0 41.6 39.0 40.7  MCV 93.3  --  91.5  --  88.6 87.8  --  86.4  PLT 316  --  202  --  170 155  --  139*   < > = values in this interval not displayed.    Medications:    . lidocaine      . amLODipine  10 mg Oral Daily  . Chlorhexidine Gluconate Cloth  6 each Topical Daily  . clonazepam  0.5 mg Oral  BID  . fentaNYL  1 patch Transdermal Q72H  . heparin  5,000 Units Subcutaneous Q8H  . hydrALAZINE  50 mg Oral Q8H  . lidocaine  6 mL Mouth/Throat Once  . mouth rinse  15 mL Mouth Rinse BID  . QUEtiapine  50 mg Oral BID  . sodium zirconium cyclosilicate  10 g Oral BID      Madelon Lips, MD 07/28/2019, 1:32 PM

## 2019-07-29 ENCOUNTER — Inpatient Hospital Stay (HOSPITAL_COMMUNITY): Payer: Self-pay

## 2019-07-29 DIAGNOSIS — J4540 Moderate persistent asthma, uncomplicated: Secondary | ICD-10-CM

## 2019-07-29 LAB — GLUCOSE, CAPILLARY
Glucose-Capillary: 38 mg/dL — CL (ref 70–99)
Glucose-Capillary: 148 mg/dL — ABNORMAL HIGH (ref 70–99)
Glucose-Capillary: 172 mg/dL — ABNORMAL HIGH (ref 70–99)
Glucose-Capillary: 155 mg/dL — ABNORMAL HIGH (ref 70–99)
Glucose-Capillary: 151 mg/dL — ABNORMAL HIGH (ref 70–99)

## 2019-07-29 LAB — BASIC METABOLIC PANEL
Anion gap: 12 (ref 5–15)
BUN: 59 mg/dL — ABNORMAL HIGH (ref 6–20)
CO2: 21 mmol/L — ABNORMAL LOW (ref 22–32)
Calcium: 8.4 mg/dL — ABNORMAL LOW (ref 8.9–10.3)
Chloride: 118 mmol/L — ABNORMAL HIGH (ref 98–111)
Creatinine, Ser: 1.58 mg/dL — ABNORMAL HIGH (ref 0.61–1.24)
GFR calc Af Amer: >60 mL/min (ref >60)
GFR calc non Af Amer: 53 mL/min — ABNORMAL LOW (ref >60)
Glucose, Bld: 112 mg/dL — ABNORMAL HIGH (ref 70–99)
Potassium: 4.9 mmol/L (ref 3.5–5.1)
Sodium: 151 mmol/L — ABNORMAL HIGH (ref 135–145)

## 2019-07-29 LAB — MAGNESIUM
Magnesium: 1.9 mg/dL (ref 1.7–2.4)
Magnesium: 1.7 mg/dL (ref 1.7–2.4)

## 2019-07-29 LAB — POCT I-STAT 7, (LYTES, BLD GAS, ICA,H+H)
Bicarbonate: 25.1 mmol/L (ref 20.0–28.0)
Calcium, Ion: 1.22 mmol/L (ref 1.15–1.40)
HCT: 37 % — ABNORMAL LOW (ref 39.0–52.0)
Hemoglobin: 12.6 g/dL — ABNORMAL LOW (ref 13.0–17.0)
O2 Saturation: 100 %
Patient temperature: 100.3
Potassium: 4.2 mmol/L (ref 3.5–5.1)
Sodium: 152 mmol/L — ABNORMAL HIGH (ref 135–145)
TCO2: 26 mmol/L (ref 22–32)
pCO2 arterial: 43.7 mmHg (ref 32.0–48.0)
pH, Arterial: 7.371 (ref 7.350–7.450)
pO2, Arterial: 305 mmHg — ABNORMAL HIGH (ref 83.0–108.0)

## 2019-07-29 LAB — PHOSPHORUS
Phosphorus: 3.6 mg/dL (ref 2.5–4.6)
Phosphorus: 3.8 mg/dL (ref 2.5–4.6)

## 2019-07-29 MED ORDER — INSULIN ASPART 100 UNIT/ML ~~LOC~~ SOLN
0.0000 [IU] | SUBCUTANEOUS | Status: DC
  Administered 2019-07-29 – 2019-07-30 (×4): 2 [IU] via SUBCUTANEOUS

## 2019-07-29 MED ORDER — METHYLPREDNISOLONE SODIUM SUCC 40 MG IJ SOLR
40.0000 mg | Freq: Two times a day (BID) | INTRAMUSCULAR | Status: DC
  Administered 2019-07-29 (×2): 40 mg via INTRAVENOUS
  Filled 2019-07-29 (×2): qty 1

## 2019-07-29 MED ORDER — CLONAZEPAM 0.5 MG PO TBDP
1.0000 mg | ORAL_TABLET | Freq: Two times a day (BID) | ORAL | Status: AC
  Administered 2019-07-29: 10:00:00 0.5 mg
  Filled 2019-07-29: qty 2

## 2019-07-29 MED ORDER — CLONAZEPAM 0.5 MG PO TBDP: 1.0000 mg | ORAL_TABLET | Freq: Two times a day (BID) | ORAL | Status: DC

## 2019-07-29 MED ORDER — FREE WATER
300.0000 mL | Freq: Four times a day (QID) | Status: DC
  Administered 2019-07-29 – 2019-08-01 (×11): 300 mL

## 2019-07-29 MED ORDER — CLONAZEPAM 0.5 MG PO TBDP
1.0000 mg | ORAL_TABLET | Freq: Two times a day (BID) | ORAL | Status: DC
  Administered 2019-07-29 – 2019-08-01 (×5): 1 mg
  Filled 2019-07-29 (×5): qty 2

## 2019-07-29 NOTE — Progress Notes (Signed)
Have held ativan due to concerns w/ decline in respiratory status. Pt very agitated, yelling. Currently maxed out on Precedex at 1.7.  Klonopin has already been increased this AM.  Spoke w/ CCM MD who gave ok for Ativan PRN.  Will continue to monitor respiratory status closely.

## 2019-07-29 NOTE — Progress Notes (Signed)
Pt now on TF, increasing CBG and no coverage.  Pt also has new temp of 101. Spoke w/ Dr. Ander Slade to relay. Received verbal orders for sensitive sliding scale insulin coverage and blood cultures x2.

## 2019-07-29 NOTE — Progress Notes (Signed)
Patient had desaturation episode sustaining high 70s-mid 80s. Placed on NRB 15L. Notified Elink and RT. RT at bedside. A&Ox4. Patient endorses anxiety. Xray, ABG, and neb tx ordered and carried out. Patient not clearing throat well. Tube feed disconnected from cortrak.  0435am Strict NPO enforced due to desaturations. Patient states he is going to "show his ass" in regards to not being allowed to have ice chips. Patient refusing blood draw from lab and for gown and linen to be changed at this time.  0445am Trialed pt on 6L [place] and continues to desat into 80s. NRB 15L placed again. Pt appears comfortable and resting. Continues to refuse lab draw and bed change.   Will continue to monitor.

## 2019-07-29 NOTE — Progress Notes (Signed)
[facility] Progress Note Patient Name: [name] DOB: May 18, 1976 MRN: [id number]   Date of Service  07/29/2019  HPI/Events of Note  Abrupt desaturation of unclear etiology. RN states he is wheezy. Discrepancy between pulse oximeter readings .  eICU Interventions  CXR, ABG, Nebulizer Rx.        Kerry Kass [name] 07/29/2019, 3:45 AM

## 2019-07-29 NOTE — Progress Notes (Addendum)
Pt on NRB all AM.  Attempted HFNC at 15L, however SpO2 in low 80s.  Nasopharyngeal suctioning performed, small amt of sputum, however minimal improvement.  Venturi mask placed at 50%. SpO2 high 80s, turned to 55%.  SpO2 currently 90%.  CCM MD and RT aware.  Will continue to monitor closely.

## 2019-07-29 NOTE — Progress Notes (Signed)
[facility] Progress Note    Assessment/ Plan:   1. Acute renal failure - nonoliguric, AKI most likely due to shock, +/- vanc toxicity. Urine lytes suggest ATN. There is no indication for RRT at this time. IV Vanc dc'd. BP's good and UOP good. Creatinine downtrending with post-ATN diuresis likely contributing to hypernatremia. 2. Hypernatremia: on D5W @ 125 mL/ hr, continue to monitor.  No polyruria.  Will do a free water flush in Coretrak today to decrease free water deficit.  300 q 6.  Lower suspicion for DI at present.  I expect Na to get better now that we can enterally feed/ hydrate.  I will sign off now.  Please call with questions.   3. Hypoglycemia: now has TF 4. Resp failure - self-extubated 11/16, now getting nebs, tenuous resp status 5. Drug abuse - found down, +mult drugs on UDS, poss overdose 6. CAD/ sepsis - on Unasyn, urine Ag + for pneumococcus. 7. Hyperkalemia: resolved 8. Dispo: pending   Subjective:    Coretrak in, getting TF now.  Na coming down very slightly- 151.  Still on D5.  Not having polyuria per chart.      Objective:   BP (!) 147/74   Pulse 88   Temp 99.6 F (37.6 C) (Axillary)   Resp (!) 25   Ht 5\' 11"  (1.803 m)   Wt (!) 150.5 kg   SpO2 92%   BMI 46.28 kg/m   Intake/Output Summary (Last 24 hours) at 07/29/2019 1111 Last data filed at 07/29/2019 1000 Gross per 24 hour  Intake 5106.37 ml  Output 3255 ml  Net 1851.37 ml   Weight change:   Physical Exam: Gen NAD, lying in bed, in restraints NECK No jvd or bruit PULM bilateral expiratory rhonchi CV RRR no MRG Abd soft ntnd no mass or ascites +bsobese Extno LEedema  Neuro confused, asking me why he is in restraints and yelling  Imaging: Dg Chest 1 View  Result Date: 07/29/2019 CLINICAL DATA:  43 year old male with abrupt oxygen desaturation. Sepsis, acute renal failure. EXAM: CHEST  1 VIEW COMPARISON:  Portable chest 07/28/2019 and earlier. FINDINGS: Portable AP semi  upright view at 0347 hours. Stable lung volumes and mediastinal contours. An enteric feeding tube now courses into the left abdomen, tip not included. Stable left rib ORIF changes. Stable pulmonary vascularity. No pneumothorax, consolidation or pleural effusion. Ventilation appears stable to mildly improved since 07/25/2019. Paucity of bowel gas in the upper abdomen. IMPRESSION: 1. Stable ventilation since yesterday, mildly improved since 07/25/19. No new cardiopulmonary abnormality. 2. Enteric feeding tube courses into the left abdomen, tip not included. Electronically Signed   By: Genevie Ann M.D.   On: 07/29/2019 03:56   Dg Abd 1 View  Result Date: 07/28/2019 CLINICAL DATA:  Nasoenteric feeding tube placement. EXAM: ABDOMEN - 1 VIEW; DG NASO G TUBE PLC W/FL-NO RAD COMPARISON:  None. FINDINGS: A single C-arm image demonstrates that the nasoenteric feeding tube is in the fourth portion of the duodenum at the ligament of Treitz in excellent position. 15 cc of Omnipaque 300 were injected to confirm position. IMPRESSION: Nasoenteric feeding tube appears in good position. 2 minutes 48 seconds fluoroscopy time. Electronically Signed   By: Lorriane Shire M.D.   On: 07/28/2019 16:11   Am Dg Chest Port 1 View  Result Date: 07/28/2019 CLINICAL DATA:  43 year old male with respiratory failure. EXAM: PORTABLE CHEST 1 VIEW COMPARISON:  Chest radiograph dated 07/25/2019. FINDINGS: There has been interval removal of the  support apparatus and right IJ central line. There is mild cardiomegaly. Diffuse interstitial prominence may represent vascular congestion/edema although atypical pneumonia is not excluded. Clinical correlation is recommended. No focal consolidation, pleural effusion, pneumothorax. Multiple internally fixed left rib fractures noted. No acute osseous pathology. IMPRESSION: 1. Cardiomegaly with probable mild vascular congestion. No focal consolidation. 2. Multiple internally fixed left rib fractures.  No  pneumothorax. Electronically Signed   By: Anner Crete M.D.   On: 07/28/2019 08:42   Dg Addison Bailey G Tube Plc W/fl-no Rad  Result Date: 07/28/2019 CLINICAL DATA:  Nasoenteric feeding tube placement. EXAM: ABDOMEN - 1 VIEW; DG NASO G TUBE PLC W/FL-NO RAD COMPARISON:  None. FINDINGS: A single C-arm image demonstrates that the nasoenteric feeding tube is in the fourth portion of the duodenum at the ligament of Treitz in excellent position. 15 cc of Omnipaque 300 were injected to confirm position. IMPRESSION: Nasoenteric feeding tube appears in good position. 2 minutes 48 seconds fluoroscopy time. Electronically Signed   By: Lorriane Shire M.D.   On: 07/28/2019 16:11    Labs: BMET Recent Labs  Lab 07/23/19 0316  07/24/19 0420 07/24/19 1753 07/25/19 0333 07/25/19 0339 07/25/19 1200 07/26/19 0417 07/27/19 0502 07/27/19 1238 07/27/19 1828 07/28/19 0326 07/28/19 1648 07/29/19 0413 07/29/19 0542  NA 139   < > 137  --  141 139  --  145 154* 155* 157* 152* 151* 152* 151*  K 4.5   < > 4.3  --  5.0 4.9  --  5.4* 5.2*  --   --  4.6  --  4.2 4.9  CL 106  --  104  --  108  --   --  114* 120*  --   --  121*  --   --  118*  CO2 19*  --  19*  --  18*  --   --  17* 20*  --   --  20*  --   --  21*  GLUCOSE 165*  --  130*  --  104*  --   --  115* 63*  --   --  151*  --   --  112*  BUN 38*  --  76*  --  109*  --   --  119* 110*  --   --  80*  --   --  59*  CREATININE 2.97*  --  4.43*  --  4.82*  --   --  4.44* 3.32*  --   --  2.07*  --   --  1.58*  CALCIUM 7.8*  --  8.2*  --  8.6*  --   --  8.5* 9.0  --   --  8.5*  --   --  8.4*  PHOS  --   --   --  6.8* 7.0*  --  6.5*  --   --   --   --  4.0 4.3  --  3.6   < > = values in this interval not displayed.   CBC Recent Labs  Lab 07/23/19 0316  07/24/19 0420 07/25/19 0333 07/25/19 0339 07/26/19 0417 07/29/19 0413  WBC 26.4*  --  18.6* 14.6*  --  12.4*  --   HGB 15.7   < > 13.4 13.3 13.3 13.2 12.6*  HCT 50.9   < > 42.0 41.6 39.0 40.7 37.0*  MCV 91.5   --  88.6 87.8  --  86.4  --   PLT 202  --  170 155  --  139*  --    < > = values in this interval not displayed.    Medications:    . amLODipine  10 mg Per Tube Daily  . chlorhexidine  15 mL Mouth Rinse BID  . Chlorhexidine Gluconate Cloth  6 each Topical Daily  . clonazepam  1 mg Per Tube BID  . feeding supplement (PRO-STAT SUGAR FREE 64)  30 mL Per Tube TID  . fentaNYL  1 patch Transdermal Q72H  . free water  300 mL Per Tube Q6H  . heparin  5,000 Units Subcutaneous Q8H  . hydrALAZINE  50 mg Per Tube Q8H  . mouth rinse  15 mL Mouth Rinse q12n4p  . methylPREDNISolone (SOLU-MEDROL) injection  40 mg Intravenous Q12H  . QUEtiapine  50 mg Per Tube BID      Madelon Lips, MD 07/29/2019, 11:11 AM

## 2019-07-29 NOTE — Progress Notes (Signed)
NAME:  [name], MRN:  [id number], DOB:  1975/09/18, LOS: 7 ADMISSION DATE:  07/22/2019, CONSULTATION DATE:  11/12 REFERRING MD:  Mesner, CHIEF COMPLAINT:  encephalopathy   Brief History   Found down in a motel, nonresponsive to narcan, hypercapniec in the ED, h/o asthma & tobacco abuse  History of present illness   [name] is a 43 year old gentleman with a history of tobacco abuse and asthma who was found down at a local motel.  He was unresponsive to Narcan administered by EMS.  He was emergently intubated in the ED.  First 2 blood gases have demonstrated significant hypercapnia and respiratory acidosis, which has been refractory to vent changes.  There is no family at bedside to give additional history.  Past Medical History  Asthma Previous pneumothoraces  Significant Hospital Events   11/12 Admission 11/12 intubation 11/12 TLC out  11/12 A Line out 11/16 extubated  Consults:  none  Procedures:   LP   Significant Diagnostic Tests:  11/12 head CT-no intracranial abnormalities, sinusitis (personally reviewed) 11/12 CT spine-no fractures,  (personally reviewed) 11/12 CXR-patchy diffuse infiltrates bilaterally,  (personally reviewed) 11/18 chest x-ray-personally reviewed showing stable interstitial infiltration  Micro Data:  Covid negative 11/12 blood cultures-drawn after antibiotics>> + for GPC>> 11/12 respiratory culture-after antibiotics 11/12 LP cultures 11/12 RVP negative 11/12 CSF Culture 11/12 Strep Pneumonia positive UAg  Antimicrobials:  11/11-11/14 ceftriaxone 11/12 azithromycin 11/14 -11/15 Unasyn 11/12-11/13 vancomycin  Interim history/subjective:   Self extubated-11/16 More agitated today Desaturated overnight, requiring nonrebreather mask  Objective   Blood pressure 137/74, pulse 83, temperature 99.6 F (37.6 C), temperature source Axillary, resp. rate (!) 21, height 5\' 11"  (1.803 m), weight (!) 150.5 kg, SpO2 97 %.         Intake/Output Summary (Last 24 hours) at 07/29/2019 0825 Last data filed at 07/29/2019 0700 Gross per 24 hour  Intake 4799.83 ml  Output 2955 ml  Net 1844.83 ml   Filed Weights   07/25/19 0440 07/26/19 0350 07/29/19 0500  Weight: (!) 156.2 kg (!) 154.4 kg (!) 150.5 kg    Examination: General: Obese, agitated HEENT: Dry oral mucosa Neuro: Alert, interactive CV: S1-S2 appreciated PULM: Decreased breath sounds bilaterally, rhonchi at the bases  GI: Bowel sounds appreciated Extremities: Warm and dry, 1+ edema Skin: no rashes or lesions   Resolved Hospital Problem list   Lactic acidosis.  Assessment & Plan:  Acute hypoxic/hypercarbic respiratory failure History of asthma Concern for aspiration pneumonia Self extubated 11/16  -Precedex for sedation -Seroquel/Klonopin -Continue oxygen supplementation -Bronchodilators  Asthma -Bronchodilators  Encephalopathy -Still requires sedation -Seroquel/Klonopin/Precedex  Illicit substance use -U tox positive for amphetamines, opiates, marijuana -Monitor closely  Sepsis secondary to community-acquired pneumonia -Completed antimicrobials  Acute kidney injury Hyponatremia -Trend electrolytes -Renal parameters improving -On D5 water  Hypertension  History of tobacco abuse Polysubstance abuse -Counseling when appropriate  Nutrition -Feeding protocol    Daily Goals Checklist  Pain/Anxiety/Delirium protocol (if indicated): Continue Precedex, Klonopin VAP protocol: Not indicated DVT prophylaxis: SCD, UFH tid Nutritional status and feeding goals: Feeding tube in place, feeding protocol GI prophylaxis: Pepcid Urinary catheter: Needs close monitoring of I/O  Glucose control: Monitor only Mobility/therapy needs: Bedrest Home medication reconciliation: Continue to monitor Daily labs: BMP daily to follow creatinine Code Status: Full Family Communication: Sister is point of contact. Disposition: ICU  Labs   CBC:  Recent Labs  Lab 07/23/19 0316  07/24/19 0420 07/25/19 0333 07/25/19 0339 07/26/19 0417 07/29/19 0413  WBC 26.4*  --  18.6* 14.6*  --  12.4*  --   HGB 15.7   < > 13.4 13.3 13.3 13.2 12.6*  HCT 50.9   < > 42.0 41.6 39.0 40.7 37.0*  MCV 91.5  --  88.6 87.8  --  86.4  --   PLT 202  --  170 155  --  139*  --    < > = values in this interval not displayed.    Basic Metabolic Panel: Recent Labs  Lab 07/25/19 0333  07/25/19 1200 07/26/19 0417 07/27/19 0502  07/27/19 1828 07/28/19 0326 07/28/19 1648 07/29/19 0413 07/29/19 0542  NA 141   < >  --  145 154*   < > 157* 152* 151* 152* 151*  K 5.0   < >  --  5.4* 5.2*  --   --  4.6  --  4.2 4.9  CL 108  --   --  114* 120*  --   --  121*  --   --  118*  CO2 18*  --   --  17* 20*  --   --  20*  --   --  21*  GLUCOSE 104*  --   --  115* 63*  --   --  151*  --   --  112*  BUN 109*  --   --  119* 110*  --   --  80*  --   --  59*  CREATININE 4.82*  --   --  4.44* 3.32*  --   --  2.07*  --   --  1.58*  CALCIUM 8.6*  --   --  8.5* 9.0  --   --  8.5*  --   --  8.4*  MG 2.8*  --  2.9* 3.0*  --   --   --  2.3 2.1  --  1.9  PHOS 7.0*  --  6.5*  --   --   --   --  4.0 4.3  --  3.6   < > = values in this interval not displayed.   GFR: Estimated Creatinine Clearance: 89.9 mL/min (A) (by C-G formula based on SCr of 1.58 mg/dL (H)). Recent Labs  Lab 07/22/19 0902 07/23/19 0316 07/23/19 0847 07/24/19 0420 07/25/19 0333 07/25/19 0427 07/26/19 0417  PROCALCITON  --   --   --  86.68 58.48  --   --   WBC  --  26.4*  --  18.6* 14.6*  --  12.4*  LATICACIDVEN 6.5*  --  1.3 0.9  --  0.6  --     Liver Function Tests: Recent Labs  Lab 07/23/19 0316 07/24/19 0420 07/25/19 0333  AST 45* 43* 27  ALT 48* 45* 43  ALKPHOS 61 54 50  BILITOT 0.7 0.6 0.6  PROT 6.4* 6.1* 6.3*  ALBUMIN 2.8* 2.7* 2.8*   No results for input(s): LIPASE, AMYLASE in the last 168 hours. No results for input(s): AMMONIA in the last 168 hours.  ABG    Component Value  Date/Time   PHART 7.371 07/29/2019 0413   PCO2ART 43.7 07/29/2019 0413   PO2ART 305.0 (H) 07/29/2019 0413   HCO3 25.1 07/29/2019 0413   TCO2 26 07/29/2019 0413   ACIDBASEDEF 8.0 (H) 07/25/2019 0339   O2SAT 100.0 07/29/2019 0413     Coagulation Profile: No results for input(s): INR, PROTIME in the last 168 hours.  Cardiac Enzymes: Recent Labs  Lab 07/24/19 1122  CKTOTAL 1,108*    HbA1C: Hgb  A1c MFr Bld  Date/Time Value Ref Range Status  07/22/2019 09:02 AM 5.9 (H) 4.8 - 5.6 % Final    Comment:    (NOTE) Pre diabetes:          5.7%-6.4% Diabetes:              >6.4% Glycemic control for   <7.0% adults with diabetes     CBG: Recent Labs  Lab 07/28/19 1140 07/28/19 1609 07/28/19 1949 07/28/19 2321 07/29/19 0737  GLUCAP 176* 143* 150* 162* 148*    The patient is critically ill with multiple organ systems failure and requires high complexity decision making for assessment and support, frequent evaluation and titration of therapies, application of advanced monitoring technologies and extensive interpretation of multiple databases. Critical Care Time devoted to patient care services described in this note independent of APP/resident time (if applicable)  is 33 minutes.   Sherrilyn Rist MD [facility] Personal pager: [phone] If unanswered, please page CCM On-call: [phone]

## 2019-07-29 NOTE — Progress Notes (Addendum)
Patient linen and gown wet with urine. Patient refusing to allow [place] to change external catheter and linen. Requesting we wait until morning. Educated on effects of leaving soiled linen on skin. Continues to refuse. Patient states he doesn't feel damp. Sheets and gown suggest otherwise. Will continue to monitor and encourage change of linen.

## 2019-07-30 ENCOUNTER — Inpatient Hospital Stay (HOSPITAL_COMMUNITY): Payer: Self-pay

## 2019-07-30 LAB — BASIC METABOLIC PANEL
Anion gap: 9 (ref 5–15)
BUN: 46 mg/dL — ABNORMAL HIGH (ref 6–20)
CO2: 23 mmol/L (ref 22–32)
Calcium: 8.2 mg/dL — ABNORMAL LOW (ref 8.9–10.3)
Chloride: 112 mmol/L — ABNORMAL HIGH (ref 98–111)
Creatinine, Ser: 1.21 mg/dL (ref 0.61–1.24)
GFR calc Af Amer: >60 mL/min (ref >60)
GFR calc non Af Amer: >60 mL/min (ref >60)
Glucose, Bld: 178 mg/dL — ABNORMAL HIGH (ref 70–99)
Potassium: 4.5 mmol/L (ref 3.5–5.1)
Sodium: 144 mmol/L (ref 135–145)

## 2019-07-30 LAB — GLUCOSE, CAPILLARY
Glucose-Capillary: 120 mg/dL — ABNORMAL HIGH (ref 70–99)
Glucose-Capillary: 152 mg/dL — ABNORMAL HIGH (ref 70–99)
Glucose-Capillary: 171 mg/dL — ABNORMAL HIGH (ref 70–99)
Glucose-Capillary: 199 mg/dL — ABNORMAL HIGH (ref 70–99)
Glucose-Capillary: 87 mg/dL (ref 70–99)
Glucose-Capillary: 96 mg/dL (ref 70–99)

## 2019-07-30 LAB — PHOSPHORUS: Phosphorus: 3 mg/dL (ref 2.5–4.6)

## 2019-07-30 LAB — MAGNESIUM: Magnesium: 1.5 mg/dL — ABNORMAL LOW (ref 1.7–2.4)

## 2019-07-30 MED ORDER — INSULIN ASPART 100 UNIT/ML ~~LOC~~ SOLN
0.0000 [IU] | SUBCUTANEOUS | Status: DC
  Administered 2019-07-30 (×2): 3 [IU] via SUBCUTANEOUS
  Administered 2019-07-31 (×3): 2 [IU] via SUBCUTANEOUS
  Administered 2019-07-31: 3 [IU] via SUBCUTANEOUS
  Administered 2019-08-01: 01:00:00 2 [IU] via SUBCUTANEOUS

## 2019-07-30 MED ORDER — ACETAMINOPHEN 325 MG PO TABS: 650.0000 mg | ORAL_TABLET | Freq: Four times a day (QID) | ORAL | Status: DC | PRN

## 2019-07-30 MED ORDER — PREDNISONE 5 MG/5ML PO SOLN
30.0000 mg | Freq: Every day | ORAL | Status: DC
  Administered 2019-07-30 – 2019-07-31 (×2): 30 mg
  Filled 2019-07-30 (×3): qty 30

## 2019-07-30 MED ORDER — FENTANYL 50 MCG/HR TD PT72
1.0000 | MEDICATED_PATCH | TRANSDERMAL | Status: DC
  Administered 2019-07-30: 1 via TRANSDERMAL
  Filled 2019-07-30: qty 1

## 2019-07-30 MED ORDER — ALBUTEROL SULFATE (2.5 MG/3ML) 0.083% IN NEBU: 2.5000 mg | INHALATION_SOLUTION | RESPIRATORY_TRACT | Status: DC | PRN

## 2019-07-30 MED ORDER — ALUM & MAG HYDROXIDE-SIMETH 200-200-20 MG/5ML PO SUSP
30.0000 mL | ORAL | Status: DC | PRN
  Administered 2019-07-30: 30 mL via ORAL
  Filled 2019-07-30: qty 30

## 2019-07-30 MED ORDER — RESOURCE THICKENUP CLEAR PO POWD
ORAL | Status: DC | PRN
  Filled 2019-07-30: qty 125

## 2019-07-30 MED ORDER — IPRATROPIUM-ALBUTEROL 0.5-2.5 (3) MG/3ML IN SOLN
3.0000 mL | Freq: Four times a day (QID) | RESPIRATORY_TRACT | Status: DC
  Administered 2019-07-30 – 2019-08-01 (×5): 3 mL via RESPIRATORY_TRACT
  Filled 2019-07-30 (×5): qty 3

## 2019-07-30 NOTE — Progress Notes (Addendum)
Pt had barium swallow evaluation today and now ordered a dysphagia 3 diet. Pt with poor appetite and encouraged to increase fluid intake. Pt refusing tube feeds at this time due to several episodes of diarrhea. Pt states, "this is hurting my stomach". Cortrak remains in place and pt tolerating at this time. No impulsive behavior throughout shift and now easily redirectable off of the Precedex.

## 2019-07-30 NOTE — Progress Notes (Signed)
[facility] Progress Note Patient Name: [name] DOB: [birth year] MRN: [id number]   Date of Service  07/30/2019  HPI/Events of Note  Hyperglycemia - Blood glucose = 151 --> 171 --> 199.   eICU Interventions  Will order: 1. Increase to Q 4 hour moderate Novolog SSI.     Intervention Category Major Interventions: Hyperglycemia - active titration of insulin therapy  Lysle Dingwall 07/30/2019, 4:37 AM

## 2019-07-30 NOTE — Progress Notes (Signed)
eLink Physician-Brief Progress Note Patient Name: [name] DOB: 21-Jan-1976 MRN: [id number]   Date of Service  07/30/2019  HPI/Events of Note  Patient c/o "heartburn". Able to take PO.   eICU Interventions  Will order: 1. Mylanta 30 mL PO Q 4 hours PRN indigestion or heartburn.      Intervention Category Major Interventions: Other:  Lynnett Langlinais Cornelia Copa 07/30/2019, 8:12 PM

## 2019-07-30 NOTE — Progress Notes (Signed)
NAME:  ARION BUFKIN, MRN:  [id number], DOB:  10-11-1975, LOS: 8 ADMISSION DATE:  07/22/2019, CONSULTATION DATE:  11/12 REFERRING MD:  Mesner, CHIEF COMPLAINT:  encephalopathy   Brief History   Found down in a motel, nonresponsive to narcan, hypercapniec in the ED, h/o asthma & tobacco abuse  History of present illness   Mr. Langseth is a 43 year old gentleman with a history of tobacco abuse and asthma who was found down at a local motel.  He was unresponsive to Narcan administered by EMS.  He was emergently intubated in the ED.  First 2 blood gases have demonstrated significant hypercapnia and respiratory acidosis, which has been refractory to vent changes.  There is no family at bedside to give additional history.  Past Medical History  Asthma Previous pneumothoraces  Significant Hospital Events   11/12 Admission 11/12 intubation 11/12 TLC out  11/12 A Line out 11/16 extubated  Consults:  none  Procedures:   LP   Significant Diagnostic Tests:  11/12 head CT-no intracranial abnormalities, sinusitis (personally reviewed) 11/12 CT spine-no fractures,  (personally reviewed) 11/12 CXR-patchy diffuse infiltrates bilaterally,  (personally reviewed) 11/18 chest x-ray-personally reviewed showing stable interstitial infiltration  Micro Data:  Covid negative 11/12 blood cultures-drawn after antibiotics>> + for GPC>> 11/12 respiratory culture-after antibiotics 11/12 LP cultures 11/12 RVP negative 11/12 CSF Culture 11/12 Strep Pneumonia positive UAg  Antimicrobials:  11/11-11/14 ceftriaxone 11/12 azithromycin 11/14 -11/15 Unasyn 11/12-11/13 vancomycin  Interim history/subjective:  More awake and interactive Little deintensified care  Objective   Blood pressure 129/66, pulse 79, temperature 100 F (37.8 C), temperature source Axillary, resp. rate (!) 28, height 5\' 11"  (1.803 m), weight (!) 150.8 kg, SpO2 96 %.    FiO2 (%):  [55 %] 55 %   Intake/Output Summary (Last 24  hours) at 07/30/2019 0818 Last data filed at 07/30/2019 0600 Gross per 24 hour  Intake 5288.03 ml  Output 2250 ml  Net 3038.03 ml   Filed Weights   07/26/19 0350 07/29/19 0500 07/30/19 0449  Weight: (!) 154.4 kg (!) 150.5 kg (!) 150.8 kg    Examination: General: Morbid obese male who is somewhat stunned is awake and alert and follows command HEENT: No JVD or lymphadenopathy is appreciated Neuro: Follows commands moves all extremities wants ice chips CV: Sounds regular regular rhythm PULM: No overt wheezing FiO2 decreased to 6 L with adequate O2 saturation GI: soft, bsx4 active, 2 feedings at goal Extremities: warm/dry, 1+ edema  Skin: no rashes or lesions    Resolved Hospital Problem list   Lactic acidosis.  Assessment & Plan:  Acute hypoxic/hypercarbic respiratory failure History of asthma Concern for aspiration pneumonia Self extubated 11/16  Wean FiO2 as tolerated Changes Solu-Medrol to prednisone taper over a period of 12-day Changes duo nebs to every 6 hours with as needed albuterol Mobilize DC Precedex D intensified care with attempt to get out of the intensive care unit within 24 to 48 hours    Encephalopathy/aitation 07/30/2019 try off Precedex Continue Seroquel Klonopin  07/30/2019 will decrease Duragesic to 50 mcg     Sepsis secondary to community-acquired pneumonia Resolved Completed antimicrobial therapy  Acute kidney injury Lab Results  Component Value Date   CREATININE 1.58 (H) 07/29/2019   CREATININE 2.07 (H) 07/28/2019   CREATININE 3.32 (H) 07/27/2019   Recent Labs  Lab 07/28/19 1648 07/29/19 0413 07/29/19 0542  NA 151* 152* 151*    Hyponatremia Nephrology is on board Currently receiving free water 300 cc every 6 hours 07/30/2019  D5W at 125 cc an hour 07/30/2019 No electrolytes ordered for today 07/30/2019 will order monitor make appropriate adjustments   Hypertension Currently on Norvasc and as needed Apresoline  History  of tobacco abuse Polysubstance abuse 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 Substance abuse counseling in the future  Nutrition Currently on tube feeding    Daily Goals Checklist  Pain/Anxiety/Delirium protocol (if indicated): Continue Precedex, Klonopin VAP protocol: Not indicated DVT prophylaxis: SCD, UFH tid Nutritional status and feeding goals: Feeding tube in place, feeding protocol GI prophylaxis: Pepcid Urinary catheter: Needs close monitoring of I/O  Glucose control: Monitor only Mobility/therapy needs: Bedrest Home medication reconciliation: Continue to monitor Daily labs: BMP daily to follow creatinine Code Status: Full Family Communication: 07/30/2019 patient updated at bedside Disposition: ICU  Labs   CBC: Recent Labs  Lab 07/24/19 0420 07/25/19 0333 07/25/19 0339 07/26/19 0417 07/29/19 0413  WBC 18.6* 14.6*  --  12.4*  --   HGB 13.4 13.3 13.3 13.2 12.6*  HCT 42.0 41.6 39.0 40.7 37.0*  MCV 88.6 87.8  --  86.4  --   PLT 170 155  --  139*  --     Basic Metabolic Panel: Recent Labs  Lab 07/25/19 0333  07/26/19 0417 07/27/19 0502  07/27/19 1828 07/28/19 0326 07/28/19 1648 07/29/19 0413 07/29/19 0542 07/29/19 1651 07/30/19 0538  NA 141   < > 145 154*   < > 157* 152* 151* 152* 151*  --   --   K 5.0   < > 5.4* 5.2*  --   --  4.6  --  4.2 4.9  --   --   CL 108  --  114* 120*  --   --  121*  --   --  118*  --   --   CO2 18*  --  17* 20*  --   --  20*  --   --  21*  --   --   GLUCOSE 104*  --  115* 63*  --   --  151*  --   --  112*  --   --   BUN 109*  --  119* 110*  --   --  80*  --   --  59*  --   --   CREATININE 4.82*  --  4.44* 3.32*  --   --  2.07*  --   --  1.58*  --   --   CALCIUM 8.6*  --  8.5* 9.0  --   --  8.5*  --   --  8.4*  --   --   MG 2.8*   < > 3.0*  --   --   --  2.3 2.1  --  1.9 1.7 1.5*  PHOS 7.0*   < >  --   --   --   --  4.0 4.3  --  3.6 3.8 3.0   < > = values in this interval not displayed.   GFR: Estimated  Creatinine Clearance: 90 mL/min (A) (by C-G formula based on SCr of 1.58 mg/dL (H)). Recent Labs  Lab 07/23/19 0847 07/24/19 0420 07/25/19 0333 07/25/19 0427 07/26/19 0417  PROCALCITON  --  86.68 58.48  --   --   WBC  --  18.6* 14.6*  --  12.4*  LATICACIDVEN 1.3 0.9  --  0.6  --     Liver Function Tests: Recent Labs  Lab 07/24/19 0420 07/25/19 0333  AST 43*  27  ALT 45* 43  ALKPHOS 54 50  BILITOT 0.6 0.6  PROT 6.1* 6.3*  ALBUMIN 2.7* 2.8*   No results for input(s): LIPASE, AMYLASE in the last 168 hours. No results for input(s): AMMONIA in the last 168 hours.  ABG    Component Value Date/Time   PHART 7.371 07/29/2019 0413   PCO2ART 43.7 07/29/2019 0413   PO2ART 305.0 (H) 07/29/2019 0413   HCO3 25.1 07/29/2019 0413   TCO2 26 07/29/2019 0413   ACIDBASEDEF 8.0 (H) 07/25/2019 0339   O2SAT 100.0 07/29/2019 0413     Coagulation Profile: No results for input(s): INR, PROTIME in the last 168 hours.  Cardiac Enzymes: Recent Labs  Lab 07/24/19 1122  CKTOTAL 1,108*    HbA1C: Hgb A1c MFr Bld  Date/Time Value Ref Range Status  07/22/2019 09:02 AM 5.9 (H) 4.8 - 5.6 % Final    Comment:    (NOTE) Pre diabetes:          5.7%-6.4% Diabetes:              >6.4% Glycemic control for   <7.0% adults with diabetes     CBG: Recent Labs  Lab 07/29/19 1131 07/29/19 1545 07/29/19 1950 07/30/19 0015 07/30/19 0349  GLUCAP 172* 155* 151* 171* 199*    App cct 30 min  Richardson Landry  ACNP [facility] Pager [phone] till 1 pm If no answer page [phone] 07/30/2019, 8:18 AM

## 2019-07-30 NOTE — Progress Notes (Signed)
Modified Barium Swallow Progress Note  Patient Details  Name: [name] MRN: [id number] Date of Birth: 04-16-1976  Today's Date: 07/30/2019  Modified Barium Swallow completed.  Full report located under Chart Review in the Imaging Section.  Brief recommendations include the following:  Clinical Impression  Pt has improved significantly since last MBS, particularly with general strength and sustained attention to eating and drinking tasks. He continues to have visible standing secretions in glottis at rest that he reports he cannot volitionally clear. He also has instances of premature spillage of liquids, spillage of oral residue post swallow, backflow of liquids and solids from the UES to the pyriforms. All of these contribute to frank penetration events with thin liquids, penetration after the swallow of base of tongue residue and even some instances of aspiration with thin liquids, though sensation to cough and eject aspiration (but not peentration) has improved. Nectar thick liquids are a more cohesive bolus that leaves less residue and fewer penetration events. Recommend pt initiate a mechanical soft diet and nectar thick liquids. Therapeutic efforts should focus on increasing protective coughing and throat clearing during intake.    Swallow Evaluation Recommendations       SLP Diet Recommendations: Dysphagia 3 (Mech soft) solids;Nectar thick liquid   Liquid Administration via: Cup;Straw   Medication Administration: Crushed with puree   Supervision: Staff to assist with self feeding;Full supervision/cueing for compensatory strategies   Compensations: Slow rate;Small sips/bites;Hard cough after swallow;Clear throat intermittently;Multiple dry swallows after each bite/sip              Herbie Baltimore, MA CCC-SLP  Acute Rehabilitation Services Pager [phone] Office [phone]   Jabar Krysiak, Katherene Ponto 07/30/2019,3:45 PM

## 2019-07-30 NOTE — Progress Notes (Signed)
PT Cancellation Note  Patient Details Name: [name] MRN: [id number] DOB: [birth year]   Cancelled Treatment:    Reason Eval/Treat Not Completed: Other (comment). Pt up to chair earlier and now on the way to [place]. Will continue to follow.   Shary Decamp Maycok 07/30/2019, 2:07 PM [place] Pager [phone] Office [phone]

## 2019-07-30 NOTE — Progress Notes (Signed)
Came into room and saw patient with saline water open. Unsure if patient drank any at this point. Saline taken away. Re-educated patient about need for NPO status. Patient hit various materials off of table. Removed rest of items out of reach. Continued to request water and was educated again. Vital signs stable. Will continue to monitor.

## 2019-07-30 NOTE — Progress Notes (Signed)
  Speech Language Pathology Treatment: Dysphagia  Patient Details Name: MACCOY CANIDA MRN: [id number] DOB: [birth year] Today's Date: 07/30/2019 Time: [id number] SLP Time Calculation (min) (ACUTE ONLY): 13 min  Assessment / Plan / Recommendation Clinical Impression  F/u for dysphagia.  Pt was started on a full liquid diet.  Today he is much more alert, calm, and cognizant of surroundings.  Sitting in recliner.  During consumption of thin liquids and purees he continues to demonstrate intermittent s/s of potential aspiration with wet vocal quality, weak/congested cough s/p 50% of swallows.  Given deficits noted on recent MBS, especially propensity for silent aspiration due to sensory deficits, recommend proceeding with MBS this afternoon to ensure safe resumption of oral diet.  D/W RN and secure chatted S. Minor, NP.   HPI HPI: Mr. Opdyke is a 43 year old gentleman with a history of tobacco abuse and asthma who was found down at a local motel.  He was unresponsive to Narcan administered by EMS.  He was emergently intubated in the ED.  Self extubated 11/16.  CXR suspicious for pna.      SLP Plan  MBS       Recommendations  Diet recommendations: NPO                Plan: MBS       GO                Assunta Curtis 07/30/2019, 12:39 PM  Tyrus Wilms L. Tivis Ringer, [place] Office number [phone] Pager [phone]

## 2019-07-31 ENCOUNTER — Inpatient Hospital Stay (HOSPITAL_COMMUNITY): Payer: Self-pay

## 2019-07-31 LAB — CBC WITH DIFFERENTIAL/PLATELET
Abs Immature Granulocytes: 0.22 10*3/uL — ABNORMAL HIGH (ref 0.00–0.07)
Basophils Absolute: 0 10*3/uL (ref 0.0–0.1)
Basophils Relative: 0 %
Eosinophils Absolute: 0.1 10*3/uL (ref 0.0–0.5)
Eosinophils Relative: 0 %
HCT: 39.9 % (ref 39.0–52.0)
Hemoglobin: 12.8 g/dL — ABNORMAL LOW (ref 13.0–17.0)
Immature Granulocytes: 1 %
Lymphocytes Relative: 9 %
Lymphs Abs: 1.9 10*3/uL (ref 0.7–4.0)
MCH: 28.1 pg (ref 26.0–34.0)
MCHC: 32.1 g/dL (ref 30.0–36.0)
MCV: 87.7 fL (ref 80.0–100.0)
Monocytes Absolute: 1.8 10*3/uL — ABNORMAL HIGH (ref 0.1–1.0)
Monocytes Relative: 9 %
Neutro Abs: 17.1 10*3/uL — ABNORMAL HIGH (ref 1.7–7.7)
Neutrophils Relative %: 81 %
Platelets: 167 10*3/uL (ref 150–400)
RBC: 4.55 MIL/uL (ref 4.22–5.81)
RDW: 14 % (ref 11.5–15.5)
WBC: 21.1 10*3/uL — ABNORMAL HIGH (ref 4.0–10.5)
nRBC: 0 % (ref 0.0–0.2)

## 2019-07-31 LAB — BASIC METABOLIC PANEL
Anion gap: 12 (ref 5–15)
BUN: 39 mg/dL — ABNORMAL HIGH (ref 6–20)
CO2: 20 mmol/L — ABNORMAL LOW (ref 22–32)
Calcium: 8.2 mg/dL — ABNORMAL LOW (ref 8.9–10.3)
Chloride: 112 mmol/L — ABNORMAL HIGH (ref 98–111)
Creatinine, Ser: 1 mg/dL (ref 0.61–1.24)
GFR calc Af Amer: >60 mL/min (ref >60)
GFR calc non Af Amer: >60 mL/min (ref >60)
Glucose, Bld: 112 mg/dL — ABNORMAL HIGH (ref 70–99)
Potassium: 3.1 mmol/L — ABNORMAL LOW (ref 3.5–5.1)
Sodium: 144 mmol/L (ref 135–145)

## 2019-07-31 LAB — GLUCOSE, CAPILLARY
Glucose-Capillary: 134 mg/dL — ABNORMAL HIGH (ref 70–99)
Glucose-Capillary: 135 mg/dL — ABNORMAL HIGH (ref 70–99)
Glucose-Capillary: 140 mg/dL — ABNORMAL HIGH (ref 70–99)
Glucose-Capillary: 157 mg/dL — ABNORMAL HIGH (ref 70–99)
Glucose-Capillary: 94 mg/dL (ref 70–99)
Glucose-Capillary: 95 mg/dL (ref 70–99)

## 2019-07-31 LAB — MAGNESIUM: Magnesium: 1.6 mg/dL — ABNORMAL LOW (ref 1.7–2.4)

## 2019-07-31 LAB — PHOSPHORUS: Phosphorus: 2.2 mg/dL — ABNORMAL LOW (ref 2.5–4.6)

## 2019-07-31 MED ORDER — ORAL CARE MOUTH RINSE: 15.0000 mL | Freq: Two times a day (BID) | OROMUCOSAL | Status: DC

## 2019-07-31 NOTE — Progress Notes (Signed)
Patient educated on doctor prescribed diet. Patient uncle brought food to hospital. RN placed food in fridge with patient sticker on bag and drink. Patient claims that money is in bag with food. Verified with Apolonio Schneiders RN that no money was in bag. Showed to patient and placed back into fridge. Pt continues to claim that there is money there. Will continue to monitor.

## 2019-07-31 NOTE — Progress Notes (Signed)
NAME:  [name], MRN:  [id number], DOB:  Aug 23, 1976, LOS: 9 ADMISSION DATE:  07/22/2019, CONSULTATION DATE:  11/12 REFERRING MD:  Mesner, CHIEF COMPLAINT:  encephalopathy   Brief History   Found down in a motel, nonresponsive to narcan, hypercapniec in the ED, h/o asthma & tobacco abuse  History of present illness   [name] is a 43 year old gentleman with a history of tobacco abuse and asthma who was found down at a local motel.  He was unresponsive to Narcan administered by EMS.  He was emergently intubated in the ED.  First 2 blood gases have demonstrated significant hypercapnia and respiratory acidosis, which has been refractory to vent changes.   He has improved Extubated, on nasal cannula  Past Medical History  Asthma Previous pneumothoraces  Significant Hospital Events   11/12 Admission 11/12 intubation 11/12 TLC out  11/12 A Line out 11/16 extubated  Consults:  none  Procedures:   LP   Significant Diagnostic Tests:  11/12 head CT-no intracranial abnormalities, sinusitis (personally reviewed) 11/12 CT spine-no fractures,  (personally reviewed) 11/12 CXR-patchy diffuse infiltrates bilaterally,  (personally reviewed) 11/18 chest x-ray-personally reviewed showing stable interstitial infiltration  Micro Data:  Covid negative 11/12 blood cultures-drawn after antibiotics>> + for GPC>> 11/12 respiratory culture-after antibiotics 11/12 LP cultures 11/12 RVP negative 11/12 CSF Culture 11/12 Strep Pneumonia positive UAg  Antimicrobials:  11/11-11/14 ceftriaxone 11/12 azithromycin 11/14 -11/15 Unasyn 11/12-11/13 vancomycin  Interim history/subjective:  More awake and interactive No overnight events Out of bed in chair, interactive  Objective   Blood pressure (!) 146/75, pulse (!) 103, temperature 98.7 F (37.1 C), temperature source Oral, resp. rate (!) 22, height 5\' 11"  (1.803 m), weight (!) 151.1 kg, SpO2 95 %.        Intake/Output Summary (Last  24 hours) at 07/31/2019 0806 Last data filed at 07/31/2019 0600 Gross per 24 hour  Intake 1714.81 ml  Output 2750 ml  Net -1035.19 ml   Filed Weights   07/29/19 0500 07/30/19 0449 07/31/19 0500  Weight: (!) 150.5 kg (!) 150.8 kg (!) 151.1 kg    Examination: General: Morbid obese, following commands HEENT: Moist oral mucosa, no elevation of JVD Neuro: No neurological focality  CV: S1-S2 appreciated PULM: Clear breath sounds GI: Bowel sounds appreciated Extremities/skin: Warm/dry, 1+ edema in extremity   Resolved Hospital Problem list   Lactic acidosis.  Assessment & Plan:   Acute hypoxemic respiratory failure Hypercarbic respiratory failure History of asthma Self extubated 11/16 -Continue to wean FiO2 as tolerated -Prednisone taper -Bronchodilators -Mobilize as tolerated  Encephalopathy/agitation -Has been off Precedex for over 24 hours -Continue Seroquel/Klonopin -Duragesic dose decreased  Sepsis secondary to community-acquired pneumonia -Completed antimicrobial therapy  Acute kidney injury -Kidney functions improving -Avoid nephrotoxic's -Decrease fluids  Hypernatremia -Resolved  Hypertension -Continue Norvasc  History of tobacco use Polysubstance abuse History of heroin and methamphetamine use -Substance abuse counseling  Nutrition -On tube feeds -Advance diet as tolerated  Transfer to hospitalist service Transfer to medical floor  Daily Goals Checklist  Pain/Anxiety/Delirium protocol (if indicated): Klonopin/Seroquel VAP protocol: Not indicated DVT prophylaxis: SCD, UFH tid Nutritional status and feeding goals: Feeding tube in place, feeding protocol GI prophylaxis: Pepcid Glucose control: Monitor only Mobility/therapy needs: Ambulate as tolerated Home medication reconciliation: Continue to monitor Daily labs: BMP daily to follow creatinine Code Status: Full Family Communication: 07/30/2019 patient updated at bedside Disposition: ICU   Labs   CBC: Recent Labs  Lab 07/25/19 0333 07/25/19 0339 07/26/19 0417 07/29/19 0413 07/31/19  0432  WBC 14.6*  --  12.4*  --  21.1*  NEUTROABS  --   --   --   --  17.1*  HGB 13.3 13.3 13.2 12.6* 12.8*  HCT 41.6 39.0 40.7 37.0* 39.9  MCV 87.8  --  86.4  --  87.7  PLT 155  --  139*  --  [id number]    Basic Metabolic Panel: Recent Labs  Lab 07/27/19 0502  07/28/19 0326 07/28/19 1648 07/29/19 0413 07/29/19 0542 07/29/19 1651 07/30/19 0538 07/31/19 0432  NA 154*   < > 152* 151* 152* 151*  --  144 144  K 5.2*  --  4.6  --  4.2 4.9  --  4.5 3.1*  CL 120*  --  121*  --   --  118*  --  112* 112*  CO2 20*  --  20*  --   --  21*  --  23 20*  GLUCOSE 63*  --  151*  --   --  112*  --  178* 112*  BUN 110*  --  80*  --   --  59*  --  46* 39*  CREATININE 3.32*  --  2.07*  --   --  1.58*  --  1.21 1.00  CALCIUM 9.0  --  8.5*  --   --  8.4*  --  8.2* 8.2*  MG  --   --  2.3 2.1  --  1.9 1.7 1.5* 1.6*  PHOS  --   --  4.0 4.3  --  3.6 3.8 3.0 2.2*   < > = values in this interval not displayed.   GFR: Estimated Creatinine Clearance: 142.3 mL/min (by C-G formula based on SCr of 1 mg/dL). Recent Labs  Lab 07/25/19 0333 07/25/19 0427 07/26/19 0417 07/31/19 0432  PROCALCITON 58.48  --   --   --   WBC 14.6*  --  12.4* 21.1*  LATICACIDVEN  --  0.6  --   --    The patient is critically ill with multiple organ systems failure and requires high complexity decision making for assessment and support, frequent evaluation and titration of therapies, application of advanced monitoring technologies and extensive interpretation of multiple databases. Critical Care Time devoted to patient care services described in this note independent of APP/resident time (if applicable)  is 30 minutes.   Sherrilyn Rist MD [facility] Personal pager: [phone] If unanswered, please page CCM On-call: [phone]

## 2019-07-31 NOTE — Progress Notes (Signed)
PCCM to [facility] transfer:  Patient with h/o morbid obesity (BMI 46) and depression who was admitted on 11/12 after being found down in a motel and emergently intubated by EMS, thought to be secondary to polysubstance abuse.  He self-extubated on 11/16.  Improved, transfer to Med Surg, has Cortrak in place with ongoing swallow dysfunction.  Profoundly weak, likely needs to get stronger for a couple of days before ready for discharge.  TRH will assume care on 11/22.   Carlyon Shadow, M.D.

## 2019-07-31 NOTE — Progress Notes (Signed)
  Speech Language Pathology Treatment: Dysphagia  Patient Details Name: [name] MRN: [id number] DOB: [birth year] Today's Date: 07/31/2019 Time: 0900-0908 SLP Time Calculation (min) (ACUTE ONLY): 8 min  Assessment / Plan / Recommendation Clinical Impression  Pt up in chair, has nectar thick liquids at bedside which he is tolerating well. No immediate coughing but airway is still congested at rest. Provided min verbal cueing and model to elicit preventative coughing and throat clearing. Pt is reluctant to participate in appropriate self care (sitting in pooled feces), but carried over coughing strategy with initial verbal suggestion to do so, x3. Hopeful that with improved secretion management and also management of pts reported GERD symptoms, swallowing will be dramatically improved next week. Pt may have solid foods of choice, but still nectar thick liquids.   HPI HPI: [name] is a 43 year old gentleman with a history of tobacco abuse and asthma who was found [place] motel.  He was unresponsive to Narcan administered by EMS.  He was emergently intubated in the ED.  Self extubated 11/16.  CXR suspicious for pna.      SLP Plan  Continue with current plan of care       Recommendations  Diet recommendations: Nectar-thick liquid;Dysphagia 3 (mechanical soft) Liquids provided via: Straw Medication Administration: Crushed with puree                Follow up Recommendations: Inpatient Rehab SLP Visit Diagnosis: Dysphagia, oropharyngeal phase (R13.12) Plan: Continue with current plan of care       GO                Ajna Moors, Katherene Ponto 07/31/2019, 10:16 AM

## 2019-08-01 ENCOUNTER — Other Ambulatory Visit: Payer: Self-pay

## 2019-08-01 ENCOUNTER — Emergency Department (HOSPITAL_COMMUNITY): Payer: Self-pay

## 2019-08-01 ENCOUNTER — Emergency Department (HOSPITAL_COMMUNITY)
Admission: EM | Admit: 2019-08-01 | Discharge: 2019-08-01 | Discharge status: Against Medical Advice | Payer: Self-pay | Attending: Emergency Medicine | Admitting: Emergency Medicine

## 2019-08-01 DIAGNOSIS — D696 Thrombocytopenia, unspecified: Secondary | ICD-10-CM | POA: Insufficient documentation

## 2019-08-01 DIAGNOSIS — Z5329 Procedure and treatment not carried out because of patient's decision for other reasons: Secondary | ICD-10-CM | POA: Insufficient documentation

## 2019-08-01 DIAGNOSIS — J9621 Acute and chronic respiratory failure with hypoxia: Secondary | ICD-10-CM | POA: Insufficient documentation

## 2019-08-01 DIAGNOSIS — J45909 Unspecified asthma, uncomplicated: Secondary | ICD-10-CM | POA: Insufficient documentation

## 2019-08-01 DIAGNOSIS — Z79899 Other long term (current) drug therapy: Secondary | ICD-10-CM | POA: Insufficient documentation

## 2019-08-01 DIAGNOSIS — J9601 Acute respiratory failure with hypoxia: Secondary | ICD-10-CM

## 2019-08-01 DIAGNOSIS — F17210 Nicotine dependence, cigarettes, uncomplicated: Secondary | ICD-10-CM | POA: Insufficient documentation

## 2019-08-01 LAB — BASIC METABOLIC PANEL
Anion gap: 9 (ref 5–15)
BUN: 28 mg/dL — ABNORMAL HIGH (ref 6–20)
CO2: 23 mmol/L (ref 22–32)
Calcium: 8.4 mg/dL — ABNORMAL LOW (ref 8.9–10.3)
Chloride: 110 mmol/L (ref 98–111)
Creatinine, Ser: 0.99 mg/dL (ref 0.61–1.24)
GFR calc Af Amer: >60 mL/min (ref >60)
GFR calc non Af Amer: >60 mL/min (ref >60)
Glucose, Bld: 113 mg/dL — ABNORMAL HIGH (ref 70–99)
Potassium: 3.3 mmol/L — ABNORMAL LOW (ref 3.5–5.1)
Sodium: 142 mmol/L (ref 135–145)

## 2019-08-01 LAB — CBC
HCT: 43.6 % (ref 39.0–52.0)
Hemoglobin: 13.9 g/dL (ref 13.0–17.0)
MCH: 28.4 pg (ref 26.0–34.0)
MCHC: 31.9 g/dL (ref 30.0–36.0)
MCV: 89.2 fL (ref 80.0–100.0)
Platelets: 162 10*3/uL (ref 150–400)
RBC: 4.89 MIL/uL (ref 4.22–5.81)
RDW: 14.2 % (ref 11.5–15.5)
WBC: 20.7 10*3/uL — ABNORMAL HIGH (ref 4.0–10.5)
nRBC: 0 % (ref 0.0–0.2)

## 2019-08-01 LAB — CBC WITH DIFFERENTIAL/PLATELET
Abs Immature Granulocytes: 0.1 10*3/uL — ABNORMAL HIGH (ref 0.00–0.07)
Basophils Absolute: 0.1 10*3/uL (ref 0.0–0.1)
Basophils Relative: 0 %
Eosinophils Absolute: 0 10*3/uL (ref 0.0–0.5)
Eosinophils Relative: 0 %
HCT: 46.8 % (ref 39.0–52.0)
Hemoglobin: 13 g/dL (ref 13.0–17.0)
Immature Granulocytes: 1 %
Lymphocytes Relative: 5 %
Lymphs Abs: 0.6 10*3/uL — ABNORMAL LOW (ref 0.7–4.0)
MCH: 28.2 pg (ref 26.0–34.0)
MCHC: 27.8 g/dL — ABNORMAL LOW (ref 30.0–36.0)
MCV: 101.5 fL — ABNORMAL HIGH (ref 80.0–100.0)
Monocytes Absolute: 0.5 10*3/uL (ref 0.1–1.0)
Monocytes Relative: 4 %
Neutro Abs: 10.9 10*3/uL — ABNORMAL HIGH (ref 1.7–7.7)
Neutrophils Relative %: 90 %
Platelets: 23 10*3/uL — CL (ref 150–400)
RBC: 4.61 MIL/uL (ref 4.22–5.81)
RDW: 14.2 % (ref 11.5–15.5)
WBC: 12.2 10*3/uL — ABNORMAL HIGH (ref 4.0–10.5)
nRBC: 0 % (ref 0.0–0.2)

## 2019-08-01 LAB — GLUCOSE, CAPILLARY
Glucose-Capillary: 108 mg/dL — ABNORMAL HIGH (ref 70–99)
Glucose-Capillary: 145 mg/dL — ABNORMAL HIGH (ref 70–99)
Glucose-Capillary: 97 mg/dL (ref 70–99)

## 2019-08-01 LAB — COMPREHENSIVE METABOLIC PANEL
ALT: 81 U/L — ABNORMAL HIGH (ref 0–44)
AST: 29 U/L (ref 15–41)
Albumin: 3.2 g/dL — ABNORMAL LOW (ref 3.5–5.0)
Alkaline Phosphatase: 62 U/L (ref 38–126)
Anion gap: 10 (ref 5–15)
BUN: 33 mg/dL — ABNORMAL HIGH (ref 6–20)
CO2: 24 mmol/L (ref 22–32)
Calcium: 8.5 mg/dL — ABNORMAL LOW (ref 8.9–10.3)
Chloride: 109 mmol/L (ref 98–111)
Creatinine, Ser: 1.27 mg/dL — ABNORMAL HIGH (ref 0.61–1.24)
GFR calc Af Amer: >60 mL/min (ref >60)
GFR calc non Af Amer: >60 mL/min (ref >60)
Glucose, Bld: 167 mg/dL — ABNORMAL HIGH (ref 70–99)
Potassium: 3.5 mmol/L (ref 3.5–5.1)
Sodium: 143 mmol/L (ref 135–145)
Total Bilirubin: 0.6 mg/dL (ref 0.3–1.2)
Total Protein: 7.1 g/dL (ref 6.5–8.1)

## 2019-08-01 MED ORDER — POTASSIUM CHLORIDE CRYS ER 20 MEQ PO TBCR: 40.0000 meq | EXTENDED_RELEASE_TABLET | Freq: Two times a day (BID) | ORAL | Status: DC

## 2019-08-01 MED ORDER — AMLODIPINE BESYLATE 5 MG PO TABS
10.0000 mg | ORAL_TABLET | Freq: Once | ORAL | Status: AC
  Administered 2019-08-01: 10 mg via ORAL
  Filled 2019-08-01: qty 2

## 2019-08-01 MED ORDER — PREDNISONE 20 MG PO TABS: 20.0000 mg | ORAL_TABLET | Freq: Every day | ORAL | Status: DC

## 2019-08-01 MED ORDER — SODIUM CHLORIDE 0.9 % IV BOLUS
1000.0000 mL | Freq: Once | INTRAVENOUS | Status: AC
  Administered 2019-08-01: 1000 mL via INTRAVENOUS

## 2019-08-01 NOTE — Discharge Summary (Signed)
Physician Discharge Summary  [name] [id number] DOB: Feb 14, 1976 DOA: 07/22/2019  PCP: Patient, No Pcp Per  Admit date: 07/22/2019 Discharge date: 08/01/2019  Disposition: Left against medical advice   Brief/Interim Summary: [name] is a 43 year old gentleman with a history of tobacco abuse and asthma who was [place]. He was unresponsive to Narcan administered by EMS. He was emergently intubated in the ED. First 2 blood gases have demonstrated significant hypercapnia and respiratory acidosis. He was extubated 11/16 and transferred to [facility] 11/22.   Discharge Diagnoses:   Acute hypoxemic, hypercapnic respiratory failure Asthma Acute metabolic encephalopathy Sepsis secondary to CAP AKI Hypernatremia Hypertension History of tobacco abuse, polysubstance abuse Poor PO intake   Discharge Instructions   Allergies as of 08/01/2019      Reactions   Sulfonamide Derivatives    REACTION: Unknown reaction   Vicodin [hydrocodone-acetaminophen] Itching         Allergies  Allergen Reactions  . Sulfonamide Derivatives     REACTION: Unknown reaction  . Vicodin [Hydrocodone-Acetaminophen] Itching     Procedures/Studies: Dg Chest 1 View  Result Date: 07/29/2019 CLINICAL DATA:  43 year old male with abrupt oxygen desaturation. Sepsis, acute renal failure. EXAM: CHEST  1 VIEW COMPARISON:  Portable chest 07/28/2019 and earlier. FINDINGS: Portable AP semi upright view at 0347 hours. Stable lung volumes and mediastinal contours. An enteric feeding tube now courses into the left abdomen, tip not included. Stable left rib ORIF changes. Stable pulmonary vascularity. No pneumothorax, consolidation or pleural effusion. Ventilation appears stable to mildly improved since 07/25/2019. Paucity of bowel gas in the upper abdomen. IMPRESSION: 1. Stable ventilation since yesterday, mildly improved since 07/25/19. No new cardiopulmonary abnormality. 2. Enteric  feeding tube courses into the left abdomen, tip not included. Electronically Signed   By: Genevie Ann M.D.   On: 07/29/2019 03:56   Dg Abd 1 View  Result Date: 07/28/2019 CLINICAL DATA:  Nasoenteric feeding tube placement. EXAM: ABDOMEN - 1 VIEW; DG NASO G TUBE PLC W/FL-NO RAD COMPARISON:  None. FINDINGS: A single C-arm image demonstrates that the nasoenteric feeding tube is in the fourth portion of the duodenum at the ligament of Treitz in excellent position. 15 cc of Omnipaque 300 were injected to confirm position. IMPRESSION: Nasoenteric feeding tube appears in good position. 2 minutes 48 seconds fluoroscopy time. Electronically Signed   By: Lorriane Shire M.D.   On: 07/28/2019 16:11   Dg Abd 1 View  Result Date: 07/25/2019 CLINICAL DATA:  NG tube placement EXAM: ABDOMEN - 1 VIEW COMPARISON:  None. FINDINGS: NG tube tip and side port project over the stomach. Nonobstructive bowel gas pattern. IMPRESSION: Appropriate position of nasogastric tube with side port projecting over the stomach. Electronically Signed   By: Ulyses Jarred M.D.   On: 07/25/2019 05:24   Ct Head Wo Contrast  Result Date: 07/22/2019 CLINICAL DATA:  Patient found unresponsive today. Possible drug overdose. EXAM: CT HEAD WITHOUT CONTRAST CT CERVICAL SPINE WITHOUT CONTRAST TECHNIQUE: Multidetector CT imaging of the head and cervical spine was performed following the standard protocol without intravenous contrast. Multiplanar CT image reconstructions of the cervical spine were also generated. COMPARISON:  None. FINDINGS: CT HEAD FINDINGS Brain: No evidence of acute infarction, hemorrhage, hydrocephalus, extra-axial collection or mass lesion/mass effect. Vascular: No hyperdense vessel or unexpected calcification. Skull: Intact.  No focal lesion. Sinuses/Orbits: The left maxillary sinus is almost completely opacified. Mucosal thickening is seen in the right maxillary sinus, scattered ethmoid air cells in  the left frontal sinus. Other:  None. CT CERVICAL SPINE FINDINGS Alignment: Normal. Skull base and vertebrae: No acute fracture. No primary bone lesion or focal pathologic process. Soft tissues and spinal canal: OG tube and NG tube noted. Disc levels:  Intervertebral disc space height is maintained. Upper chest: Punctate ground-glass opacities are present the apices bilaterally. Other: None. IMPRESSION: No acute abnormality head or cervical spine. Sinus disease worst in the left maxillary. Punctate ground-glass opacities in the lung apices worrisome for infection, including atypical infection. Electronically Signed   By: Inge Rise M.D.   On: 07/22/2019 05:09   Ct Cervical Spine Wo Contrast  Result Date: 07/22/2019 CLINICAL DATA:  Patient found unresponsive today. Possible drug overdose. EXAM: CT HEAD WITHOUT CONTRAST CT CERVICAL SPINE WITHOUT CONTRAST TECHNIQUE: Multidetector CT imaging of the head and cervical spine was performed following the standard protocol without intravenous contrast. Multiplanar CT image reconstructions of the cervical spine were also generated. COMPARISON:  None. FINDINGS: CT HEAD FINDINGS Brain: No evidence of acute infarction, hemorrhage, hydrocephalus, extra-axial collection or mass lesion/mass effect. Vascular: No hyperdense vessel or unexpected calcification. Skull: Intact.  No focal lesion. Sinuses/Orbits: The left maxillary sinus is almost completely opacified. Mucosal thickening is seen in the right maxillary sinus, scattered ethmoid air cells in the left frontal sinus. Other: None. CT CERVICAL SPINE FINDINGS Alignment: Normal. Skull base and vertebrae: No acute fracture. No primary bone lesion or focal pathologic process. Soft tissues and spinal canal: OG tube and NG tube noted. Disc levels:  Intervertebral disc space height is maintained. Upper chest: Punctate ground-glass opacities are present the apices bilaterally. Other: None. IMPRESSION: No acute abnormality head or cervical spine. Sinus disease  worst in the left maxillary. Punctate ground-glass opacities in the lung apices worrisome for infection, including atypical infection. Electronically Signed   By: Inge Rise M.D.   On: 07/22/2019 05:09   US Renal  Result Date: 07/23/2019 CLINICAL DATA:  Acute kidney injury. EXAM: RENAL / URINARY TRACT ULTRASOUND COMPLETE COMPARISON:  None. FINDINGS: Right Kidney: Renal measurements: 11.1 x 6.0 x 5.8 cm = volume: 204.0 mL . Echogenicity within normal limits. No mass or hydronephrosis visualized. Left Kidney: Renal measurements: 12.5 x 6.4 x 5.6 cm = volume: 189.3 mL. Echogenicity within normal limits. No mass or hydronephrosis visualized. Bladder: Decompressed with a Foley catheter in place. Other: None. IMPRESSION: Negative for hydronephrosis.  Negative exam. Electronically Signed   By: Inge Rise M.D.   On: 07/23/2019 11:28   Dg Pelvis Portable  Result Date: 07/22/2019 CLINICAL DATA:  Patient found unresponsive today. EXAM: PORTABLE PELVIS 1-2 VIEWS COMPARISON:  None. FINDINGS: There is no acute bony or joint abnormality. Remote healed right pubic rami fractures noted. Soft tissues unremarkable. IMPRESSION: No acute abnormality. Remote healed right superior and inferior pubic ramus fractures. Electronically Signed   By: Inge Rise M.D.   On: 07/22/2019 05:02   Am Dg Chest Port 1 View  Result Date: 07/31/2019 CLINICAL DATA:  Respiratory failure. EXAM: PORTABLE CHEST 1 VIEW COMPARISON:  Radiograph 07/29/2019. FINDINGS: Tip of the enteric tube below the diaphragm, not included in the field of view. Unchanged heart size and mediastinal contours. Stable lung volumes. No acute airspace disease, pulmonary edema, pleural effusion or pneumothorax. Remote right rib fractures. Prior left rib fixation. IMPRESSION: Unchanged radiographic appearance of the chest.  No acute findings. Electronically Signed   By: Keith Rake M.D.   On: 07/31/2019 06:34   Am Dg Chest Port 1 View  Result  Date: 07/28/2019 CLINICAL DATA:  43 year old male with respiratory failure. EXAM: PORTABLE CHEST 1 VIEW COMPARISON:  Chest radiograph dated 07/25/2019. FINDINGS: There has been interval removal of the support apparatus and right IJ central line. There is mild cardiomegaly. Diffuse interstitial prominence may represent vascular congestion/edema although atypical pneumonia is not excluded. Clinical correlation is recommended. No focal consolidation, pleural effusion, pneumothorax. Multiple internally fixed left rib fractures noted. No acute osseous pathology. IMPRESSION: 1. Cardiomegaly with probable mild vascular congestion. No focal consolidation. 2. Multiple internally fixed left rib fractures.  No pneumothorax. Electronically Signed   By: Anner Crete M.D.   On: 07/28/2019 08:42   Dg Chest Port 1 View  Result Date: 07/25/2019 CLINICAL DATA:  Endotracheal intubation EXAM: PORTABLE CHEST 1 VIEW COMPARISON:  07/24/2019 FINDINGS: Endotracheal tube tip is at the level of the clavicular heads. Right IJ central venous catheter tip projects over the lower SVC. There are internally fixed rib fractures. Mild interstitial opacity bilaterally, unchanged. No pleural effusion or pneumothorax. IMPRESSION: 1. Endotracheal tube tip at the level of the clavicular heads. 2. Unchanged appearance of the lungs. 3. Unchanged internally fixed rib fractures. Electronically Signed   By: Ulyses Jarred M.D.   On: 07/25/2019 05:25   Dg Chest Port 1 View  Result Date: 07/24/2019 CLINICAL DATA:  ETT placement EXAM: PORTABLE CHEST 1 VIEW COMPARISON:  07/23/2019 FINDINGS: The endotracheal tube tip is above the carina. There is a NG tube with tip below the level of the GE junction. Previous left-sided thoracoplasty with plate and screw fixation of left posterior ribs. Right IJ catheter tip projects over the SVC. The heart size is enlarged. Diffuse pulmonary vascular congestion is unchanged. Patchy airspace densities within the left  midlung and left base persists. IMPRESSION: 1. Stable support apparatus. 2. No change in aeration a lungs compared with previous exam. Electronically Signed   By: Kerby Moors M.D.   On: 07/24/2019 04:40   Dg Chest Port 1 View  Result Date: 07/23/2019 CLINICAL DATA:  Respiratory failure, intubated EXAM: PORTABLE CHEST 1 VIEW COMPARISON:  Chest radiograph from one day prior. FINDINGS: Endotracheal tube tip is 4.3 cm above the carina. Enteric tube enters stomach with the tip not seen on this image. Right internal jugular central venous catheter terminates in the middle third of the SVC. Fixation hardware overlies multiple upper left ribs, unchanged. Stable cardiomediastinal silhouette with normal heart size. No pneumothorax. No pleural effusion. Hazy opacities throughout both lungs are mildly improved. IMPRESSION: 1. Well-positioned support structures.  No pneumothorax. 2. Hazy opacities throughout both lungs, mildly improved, suggesting improving aspiration or pneumonia. Electronically Signed   By: Ilona Sorrel M.D.   On: 07/23/2019 09:19   Dg Chest Port 1 View  Result Date: 07/22/2019 CLINICAL DATA:  Postcentral line placement. EXAM: PORTABLE CHEST 1 VIEW COMPARISON:  07/22/2019. FINDINGS: Interim placement right IJ line, its tip is noted over the superior vena cava. Heart size stable. Progressive diffuse bilateral pulmonary infiltrates. No pleural effusion. Costophrenic angles incompletely imaged. Postsurgical changes multiple left ribs. Endotracheal tube and NG tube in stable position. IMPRESSION: 1. Interim placement right IJ line. No pneumothorax. Endotracheal tube and NG tube in stable position. 2.  Progressive diffuse bilateral pulmonary infiltrates/edema. Electronically Signed   By: Marcello Moores  Register   On: 07/22/2019 11:10   Dg Chest Port 1 View  Result Date: 07/22/2019 CLINICAL DATA:  Patient status post intubation. Possible drug overdose. EXAM: PORTABLE CHEST 1 VIEW COMPARISON:  Single-view  of the chest 07/24/2011. FINDINGS: Endotracheal  tube is in place with the tip in good position at the level the clavicular heads. NG tube courses into the stomach and below the inferior margin the film. There is a diffuse reticulonodular pattern throughout both lungs. Heart size is normal. The patient is status post fixation of remote left rib fractures. IMPRESSION: ETT in good position. NG tube courses into the stomach and below the inferior margin the film. Diffuse reticulonodular opacities throughout both lungs worrisome for infection including atypical infection. Electronically Signed   By: Inge Rise M.D.   On: 07/22/2019 05:01   Dg Addison Bailey G Tube Plc W/fl-no Rad  Result Date: 07/28/2019 CLINICAL DATA:  Nasoenteric feeding tube placement. EXAM: ABDOMEN - 1 VIEW; DG NASO G TUBE PLC W/FL-NO RAD COMPARISON:  None. FINDINGS: A single C-arm image demonstrates that the nasoenteric feeding tube is in the fourth portion of the duodenum at the ligament of Treitz in excellent position. 15 cc of Omnipaque 300 were injected to confirm position. IMPRESSION: Nasoenteric feeding tube appears in good position. 2 minutes 48 seconds fluoroscopy time. Electronically Signed   By: Lorriane Shire M.D.   On: 07/28/2019 16:11       Discharge Exam: Vitals:   08/01/19 0857 08/01/19 0858  BP:    Pulse: (!) 107 (!) 126  Resp: (!) 32 (!) 25  Temp:    SpO2: 98% 93%     The results of significant diagnostics from this hospitalization (including imaging, microbiology, ancillary and laboratory) are listed below for reference.     Microbiology: Recent Results (from the past 240 hour(s))  Culture, blood (routine x 2)     Status: None (Preliminary result)   Collection Time: 07/29/19 11:58 AM   Specimen: BLOOD LEFT HAND  Result Value Ref Range Status   Specimen Description BLOOD LEFT HAND  Final   Special Requests   Final    BOTTLES DRAWN AEROBIC AND ANAEROBIC Blood Culture results may not be optimal due to an  inadequate volume of blood received in culture bottles   Culture   Final    NO GROWTH 3 DAYS Performed at [facility], [place] [address], [place], [place] [address]    Report Status PENDING  Incomplete  Culture, blood (routine x 2)     Status: None (Preliminary result)   Collection Time: 07/29/19 12:04 PM   Specimen: BLOOD  Result Value Ref Range Status   Specimen Description BLOOD RIGHT FINGER  Final   Special Requests   Final    BOTTLES DRAWN AEROBIC AND ANAEROBIC Blood Culture results may not be optimal due to an inadequate volume of blood received in culture bottles   Culture   Final    NO GROWTH 3 DAYS Performed at [facility], [place] [address], [place], [place] [address]    Report Status PENDING  Incomplete     Labs: BNP (last 3 results) Recent Labs    07/22/19 0902  BNP [id number]   Basic Metabolic Panel: Recent Labs  Lab 07/28/19 0326 07/28/19 1648 07/29/19 0413 07/29/19 0542 07/29/19 1651 07/30/19 0538 07/31/19 0432 08/01/19 0805  NA 152* 151* 152* 151*  --  144 144 142  K 4.6  --  4.2 4.9  --  4.5 3.1* 3.3*  CL 121*  --   --  118*  --  112* 112* 110  CO2 20*  --   --  21*  --  23 20* 23  GLUCOSE 151*  --   --  112*  --  178* 112* 113*  BUN 80*  --   --  59*  --  46* 39* 28*  CREATININE 2.07*  --   --  1.58*  --  1.21 1.00 0.99  CALCIUM 8.5*  --   --  8.4*  --  8.2* 8.2* 8.4*  MG 2.3 2.1  --  1.9 1.7 1.5* 1.6*  --   PHOS 4.0 4.3  --  3.6 3.8 3.0 2.2*  --    Liver Function Tests: No results for input(s): AST, ALT, ALKPHOS, BILITOT, PROT, ALBUMIN in the last 168 hours. No results for input(s): LIPASE, AMYLASE in the last 168 hours. No results for input(s): AMMONIA in the last 168 hours. CBC: Recent Labs  Lab 07/26/19 0417 07/29/19 0413 07/31/19 0432 08/01/19 0805  WBC 12.4*  --  21.1* 20.7*  NEUTROABS  --   --  17.1*  --   HGB 13.2 12.6* 12.8* 13.9  HCT 40.7 37.0* 39.9 43.6  MCV 86.4  --  87.7 89.2  PLT 139*  --  167 162   Cardiac  Enzymes: No results for input(s): CKTOTAL, CKMB, CKMBINDEX, TROPONINI in the last 168 hours. BNP: Invalid input(s): POCBNP CBG: Recent Labs  Lab 07/31/19 1558 07/31/19 2007 08/01/19 0015 08/01/19 0405 08/01/19 0759  GLUCAP 134* 135* 145* 97 108*   D-Dimer No results for input(s): DDIMER in the last 72 hours. Hgb A1c No results for input(s): HGBA1C in the last 72 hours. Lipid Profile No results for input(s): CHOL, HDL, LDLCALC, TRIG, CHOLHDL, LDLDIRECT in the last 72 hours. Thyroid function studies No results for input(s): TSH, T4TOTAL, T3FREE, THYROIDAB in the last 72 hours.  Invalid input(s): FREET3 Anemia work up No results for input(s): VITAMINB12, FOLATE, FERRITIN, TIBC, IRON, RETICCTPCT in the last 72 hours. Urinalysis    Component Value Date/Time   COLORURINE YELLOW 07/22/2019 0926   APPEARANCEUR HAZY (A) 07/22/2019 0926   LABSPEC 1.008 07/22/2019 0926   PHURINE 5.0 07/22/2019 0926   GLUCOSEU NEGATIVE 07/22/2019 0926   HGBUR LARGE (A) 07/22/2019 0926   BILIRUBINUR NEGATIVE 07/22/2019 0926   KETONESUR NEGATIVE 07/22/2019 0926   PROTEINUR 30 (A) 07/22/2019 0926   UROBILINOGEN 1.0 05/20/2011 1633   NITRITE NEGATIVE 07/22/2019 0926   LEUKOCYTESUR NEGATIVE 07/22/2019 0926   Sepsis Labs Invalid input(s): PROCALCITONIN,  WBC,  LACTICIDVEN Microbiology Recent Results (from the past 240 hour(s))  Culture, blood (routine x 2)     Status: None (Preliminary result)   Collection Time: 07/29/19 11:58 AM   Specimen: BLOOD LEFT HAND  Result Value Ref Range Status   Specimen Description BLOOD LEFT HAND  Final   Special Requests   Final    BOTTLES DRAWN AEROBIC AND ANAEROBIC Blood Culture results may not be optimal due to an inadequate volume of blood received in culture bottles   Culture   Final    NO GROWTH 3 DAYS Performed at [facility], [place] [address] [place], [place] [address]    Report Status PENDING  Incomplete  Culture, blood (routine x 2)     Status:  None (Preliminary result)   Collection Time: 07/29/19 12:04 PM   Specimen: BLOOD  Result Value Ref Range Status   Specimen Description BLOOD RIGHT FINGER  Final   Special Requests   Final    BOTTLES DRAWN AEROBIC AND ANAEROBIC Blood Culture results may not be optimal due to an inadequate volume of blood received in culture bottles   Culture   Final    NO GROWTH  3 DAYS Performed at [facility], [place] [address], [place], [place] [address]    Report Status PENDING  Incomplete      SIGNED:  Dessa Phi, DO [facility] 08/01/2019, 12:10 PM

## 2019-08-01 NOTE — Discharge Instructions (Signed)
You are leaving [facility].  Your oxygen level is low and with your oxygen being this low you can develop many complications including organ failure, which includes stroke, heart attack, and death.  Your platelets are also very low, and it is unclear why this is occurring.  You need admission for both treatment and work-up and this can cause spontaneous or traumatic bleeding including in your brain, which could result in death.  You were strongly encouraged to stay in the hospital for admission.  If at any point you change your mind, you are encouraged to return.  Otherwise, you need to find a primary care physician for outpatient follow-up.
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
## 2019-08-01 NOTE — Progress Notes (Signed)
Went into [name]'s room as monitor was beeping only to discover [name] has pulled off condom cath and removed flexiseal while balloon still inflated. [name] states that he is going to leave as he "cannot get any rest." Dr. Maylene Roes paged and informed of [name]'s decision to leave. She is agreeable to [facility] as long as [name] understands risks associated with leaving. He states that he does. RN reinforced risks of leaving AMA. IVs & Cortrak removed, and [name] helped into wheelchair to be taken to ED entrance where his [facility] is supposedly waiting.

## 2019-08-01 NOTE — ED Notes (Signed)
ED Provider at bedside. 

## 2019-08-01 NOTE — Progress Notes (Signed)
After returning back upstairs, (pt's first [employer] left without him), pt starts yelling, "Nurse!" Apparently, a second [employer] was called for pt, and it was outside Ed waiting for him. He claimed he had 3 mins before the Uber left. This RN took him down to the Ed as quick as possible, where his Melburn Popper was awaiting him. In conclusion, pt has left [facility].

## 2019-08-01 NOTE — ED Notes (Signed)
Dr. Regenia Skeeter aware patients BP 176/101 with HR 115

## 2019-08-01 NOTE — Progress Notes (Signed)
This RN and 103M AD, Maudie Mercury, escorted pt down to ED entrance where his Melburn Popper was supposedly waiting. No car that matched the [place] description was present. Pt called his friend Teacher, adult education who was ordering him the [place] and requested another driver. Of course pt's phone died, and he was unable to reach [facility] as he did not know his friend's phone number. We waited downstairs for approximately 20-25 mins before telling pt that he needed to come back to 3M11 until another ride could be arranged for him. This RN reinforced that pt must have a way to leave the hospital if he desired to leave AMA. Dr. Maylene Roes paged and alerted to all recent events.
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
## 2019-08-01 NOTE — ED Notes (Addendum)
Patient leaving against medical advice, patient educated by Dr. Regenia Skeeter on the complications and dangers of leaving the hospital. Patient alert and oriented x4 and ambulatory. An After Visit Summary was printed and given to the patient. Patient has no further questions at this time.
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
## 2019-08-03 LAB — CULTURE, BLOOD (ROUTINE X 2)
Culture: NO GROWTH
Culture: NO GROWTH

## 2021-04-02 ENCOUNTER — Encounter (HOSPITAL_COMMUNITY): Payer: Self-pay

## 2021-04-02 ENCOUNTER — Other Ambulatory Visit: Payer: Self-pay

## 2021-04-02 ENCOUNTER — Emergency Department (HOSPITAL_COMMUNITY)
Admission: EM | Admit: 2021-04-02 | Discharge: 2021-04-02 | Discharge status: Against Medical Advice | Payer: Self-pay | Attending: Emergency Medicine | Admitting: Emergency Medicine

## 2021-04-02 DIAGNOSIS — R0681 Apnea, not elsewhere classified: Secondary | ICD-10-CM | POA: Insufficient documentation

## 2021-04-02 DIAGNOSIS — T40601A Poisoning by unspecified narcotics, accidental (unintentional), initial encounter: Secondary | ICD-10-CM | POA: Insufficient documentation

## 2021-04-02 DIAGNOSIS — T50901A Poisoning by unspecified drugs, medicaments and biological substances, accidental (unintentional), initial encounter: Secondary | ICD-10-CM

## 2021-04-02 DIAGNOSIS — J45909 Unspecified asthma, uncomplicated: Secondary | ICD-10-CM | POA: Insufficient documentation

## 2021-04-02 DIAGNOSIS — R000 Tachycardia, unspecified: Secondary | ICD-10-CM | POA: Insufficient documentation

## 2021-04-02 DIAGNOSIS — F17200 Nicotine dependence, unspecified, uncomplicated: Secondary | ICD-10-CM | POA: Insufficient documentation

## 2021-04-02 MED ORDER — NALOXONE HCL 4 MG/0.1ML NA LIQD
1.0000 | Freq: Once | NASAL | Status: AC
  Administered 2021-04-02: 1 via NASAL
  Filled 2021-04-02: qty 8

## 2021-04-02 NOTE — ED Notes (Signed)
Dr.Rees went in to see patient. Patient verbalized to her that he was leaving AMA. Patient will be giving intranasal Narcan to take with him and consult to Peer Consult.
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
## 2022-05-10 DEATH — deceased
